# Patient Record
Sex: Male | Born: 1935 | Race: Black or African American | Hispanic: No
Health system: Southern US, Community
[De-identification: ages and names within clinical notes are randomized; demographics above are authoritative.]

## PROBLEM LIST (undated history)

## (undated) DIAGNOSIS — E119 Type 2 diabetes mellitus without complications: Secondary | ICD-10-CM

## (undated) DIAGNOSIS — N289 Disorder of kidney and ureter, unspecified: Secondary | ICD-10-CM

## (undated) DIAGNOSIS — Z930 Tracheostomy status: Secondary | ICD-10-CM

## (undated) DIAGNOSIS — R4702 Dysphasia: Secondary | ICD-10-CM

## (undated) DIAGNOSIS — I509 Heart failure, unspecified: Secondary | ICD-10-CM

## (undated) DIAGNOSIS — D649 Anemia, unspecified: Secondary | ICD-10-CM

## (undated) DIAGNOSIS — J449 Chronic obstructive pulmonary disease, unspecified: Secondary | ICD-10-CM

---

## 2014-05-06 ENCOUNTER — Inpatient Hospital Stay (HOSPITAL_COMMUNITY)
Admission: EM | Admit: 2014-05-06 | Discharge: 2014-05-15 | DRG: 870 | Disposition: A | Discharge status: Other Acute Inpatient Hospital | Payer: Medicare Other | Attending: Internal Medicine | Admitting: Internal Medicine

## 2014-05-06 ENCOUNTER — Emergency Department (HOSPITAL_COMMUNITY): Payer: Medicare Other

## 2014-05-06 ENCOUNTER — Encounter (HOSPITAL_COMMUNITY): Payer: Self-pay | Admitting: Emergency Medicine

## 2014-05-06 DIAGNOSIS — Y95 Nosocomial condition: Secondary | ICD-10-CM | POA: Diagnosis not present

## 2014-05-06 DIAGNOSIS — Z452 Encounter for adjustment and management of vascular access device: Secondary | ICD-10-CM

## 2014-05-06 DIAGNOSIS — E870 Hyperosmolality and hypernatremia: Secondary | ICD-10-CM | POA: Diagnosis not present

## 2014-05-06 DIAGNOSIS — G9340 Encephalopathy, unspecified: Secondary | ICD-10-CM | POA: Diagnosis not present

## 2014-05-06 DIAGNOSIS — J441 Chronic obstructive pulmonary disease with (acute) exacerbation: Secondary | ICD-10-CM

## 2014-05-06 DIAGNOSIS — J189 Pneumonia, unspecified organism: Secondary | ICD-10-CM | POA: Diagnosis not present

## 2014-05-06 DIAGNOSIS — I950 Idiopathic hypotension: Secondary | ICD-10-CM

## 2014-05-06 DIAGNOSIS — N183 Chronic kidney disease, stage 3 (moderate): Secondary | ICD-10-CM | POA: Diagnosis not present

## 2014-05-06 DIAGNOSIS — D649 Anemia, unspecified: Secondary | ICD-10-CM | POA: Diagnosis present

## 2014-05-06 DIAGNOSIS — R1310 Dysphagia, unspecified: Secondary | ICD-10-CM | POA: Diagnosis not present

## 2014-05-06 DIAGNOSIS — N179 Acute kidney failure, unspecified: Secondary | ICD-10-CM | POA: Diagnosis not present

## 2014-05-06 DIAGNOSIS — I5033 Acute on chronic diastolic (congestive) heart failure: Secondary | ICD-10-CM | POA: Diagnosis not present

## 2014-05-06 DIAGNOSIS — R680 Hypothermia, not associated with low environmental temperature: Secondary | ICD-10-CM | POA: Diagnosis not present

## 2014-05-06 DIAGNOSIS — E119 Type 2 diabetes mellitus without complications: Secondary | ICD-10-CM

## 2014-05-06 DIAGNOSIS — A498 Other bacterial infections of unspecified site: Secondary | ICD-10-CM

## 2014-05-06 DIAGNOSIS — Z794 Long term (current) use of insulin: Secondary | ICD-10-CM

## 2014-05-06 DIAGNOSIS — R4702 Dysphasia: Secondary | ICD-10-CM

## 2014-05-06 DIAGNOSIS — R159 Full incontinence of feces: Secondary | ICD-10-CM | POA: Diagnosis present

## 2014-05-06 DIAGNOSIS — R532 Functional quadriplegia: Secondary | ICD-10-CM | POA: Diagnosis present

## 2014-05-06 DIAGNOSIS — Z515 Encounter for palliative care: Secondary | ICD-10-CM | POA: Insufficient documentation

## 2014-05-06 DIAGNOSIS — D638 Anemia in other chronic diseases classified elsewhere: Secondary | ICD-10-CM | POA: Diagnosis not present

## 2014-05-06 DIAGNOSIS — J42 Unspecified chronic bronchitis: Secondary | ICD-10-CM

## 2014-05-06 DIAGNOSIS — Z9911 Dependence on respirator [ventilator] status: Secondary | ICD-10-CM | POA: Diagnosis not present

## 2014-05-06 DIAGNOSIS — J9621 Acute and chronic respiratory failure with hypoxia: Secondary | ICD-10-CM | POA: Insufficient documentation

## 2014-05-06 DIAGNOSIS — I9589 Other hypotension: Secondary | ICD-10-CM

## 2014-05-06 DIAGNOSIS — J449 Chronic obstructive pulmonary disease, unspecified: Secondary | ICD-10-CM | POA: Diagnosis present

## 2014-05-06 DIAGNOSIS — I129 Hypertensive chronic kidney disease with stage 1 through stage 4 chronic kidney disease, or unspecified chronic kidney disease: Secondary | ICD-10-CM | POA: Diagnosis not present

## 2014-05-06 DIAGNOSIS — R4701 Aphasia: Secondary | ICD-10-CM | POA: Diagnosis not present

## 2014-05-06 DIAGNOSIS — I5030 Unspecified diastolic (congestive) heart failure: Secondary | ICD-10-CM | POA: Diagnosis not present

## 2014-05-06 DIAGNOSIS — I4891 Unspecified atrial fibrillation: Secondary | ICD-10-CM | POA: Diagnosis present

## 2014-05-06 DIAGNOSIS — IMO0002 Reserved for concepts with insufficient information to code with codable children: Secondary | ICD-10-CM

## 2014-05-06 DIAGNOSIS — J440 Chronic obstructive pulmonary disease with acute lower respiratory infection: Secondary | ICD-10-CM | POA: Diagnosis not present

## 2014-05-06 DIAGNOSIS — E43 Unspecified severe protein-calorie malnutrition: Secondary | ICD-10-CM | POA: Diagnosis not present

## 2014-05-06 DIAGNOSIS — L89152 Pressure ulcer of sacral region, stage 2: Secondary | ICD-10-CM | POA: Diagnosis present

## 2014-05-06 DIAGNOSIS — Z1624 Resistance to multiple antibiotics: Secondary | ICD-10-CM

## 2014-05-06 DIAGNOSIS — B9620 Unspecified Escherichia coli [E. coli] as the cause of diseases classified elsewhere: Secondary | ICD-10-CM

## 2014-05-06 DIAGNOSIS — A419 Sepsis, unspecified organism: Secondary | ICD-10-CM | POA: Diagnosis present

## 2014-05-06 DIAGNOSIS — I509 Heart failure, unspecified: Secondary | ICD-10-CM

## 2014-05-06 DIAGNOSIS — E1121 Type 2 diabetes mellitus with diabetic nephropathy: Secondary | ICD-10-CM | POA: Diagnosis present

## 2014-05-06 DIAGNOSIS — N189 Chronic kidney disease, unspecified: Secondary | ICD-10-CM

## 2014-05-06 DIAGNOSIS — Z930 Tracheostomy status: Secondary | ICD-10-CM

## 2014-05-06 DIAGNOSIS — E1165 Type 2 diabetes mellitus with hyperglycemia: Secondary | ICD-10-CM

## 2014-05-06 DIAGNOSIS — IMO0001 Reserved for inherently not codable concepts without codable children: Secondary | ICD-10-CM | POA: Insufficient documentation

## 2014-05-06 DIAGNOSIS — N139 Obstructive and reflux uropathy, unspecified: Secondary | ICD-10-CM | POA: Diagnosis not present

## 2014-05-06 DIAGNOSIS — R6520 Severe sepsis without septic shock: Secondary | ICD-10-CM | POA: Diagnosis present

## 2014-05-06 DIAGNOSIS — E872 Acidosis: Secondary | ICD-10-CM | POA: Diagnosis not present

## 2014-05-06 DIAGNOSIS — F0390 Unspecified dementia without behavioral disturbance: Secondary | ICD-10-CM | POA: Diagnosis present

## 2014-05-06 DIAGNOSIS — R0600 Dyspnea, unspecified: Secondary | ICD-10-CM

## 2014-05-06 DIAGNOSIS — N390 Urinary tract infection, site not specified: Secondary | ICD-10-CM | POA: Diagnosis present

## 2014-05-06 DIAGNOSIS — J9602 Acute respiratory failure with hypercapnia: Secondary | ICD-10-CM

## 2014-05-06 DIAGNOSIS — J9601 Acute respiratory failure with hypoxia: Secondary | ICD-10-CM

## 2014-05-06 HISTORY — DX: Anemia, unspecified: D64.9

## 2014-05-06 HISTORY — DX: Disorder of kidney and ureter, unspecified: N28.9

## 2014-05-06 HISTORY — DX: Type 2 diabetes mellitus without complications: E11.9

## 2014-05-06 HISTORY — DX: Tracheostomy status: Z93.0

## 2014-05-06 HISTORY — DX: Heart failure, unspecified: I50.9

## 2014-05-06 HISTORY — DX: Chronic obstructive pulmonary disease, unspecified: J44.9

## 2014-05-06 HISTORY — DX: Dysphasia: R47.02

## 2014-05-06 LAB — BASIC METABOLIC PANEL
Anion gap: 16 — ABNORMAL HIGH (ref 5–15)
BUN: 117 mg/dL — ABNORMAL HIGH (ref 6–23)
CALCIUM: 9.3 mg/dL (ref 8.4–10.5)
CO2: 24 meq/L (ref 19–32)
Chloride: 93 mEq/L — ABNORMAL LOW (ref 96–112)
Creatinine, Ser: 4.68 mg/dL — ABNORMAL HIGH (ref 0.50–1.35)
GFR calc Af Amer: 12 mL/min — ABNORMAL LOW (ref >90)
GFR, EST NON AFRICAN AMERICAN: 10 mL/min — AB (ref >90)
GLUCOSE: 268 mg/dL — AB (ref 70–99)
Potassium: 4.6 mEq/L (ref 3.7–5.3)
Sodium: 133 mEq/L — ABNORMAL LOW (ref 137–147)

## 2014-05-06 LAB — URINALYSIS, ROUTINE W REFLEX MICROSCOPIC
Glucose, UA: 250 mg/dL — AB
Ketones, ur: 15 mg/dL — AB
NITRITE: POSITIVE — AB
PROTEIN: 100 mg/dL — AB
Specific Gravity, Urine: 1.02 (ref 1.005–1.030)
UROBILINOGEN UA: 0.2 mg/dL (ref 0.0–1.0)
pH: 5 (ref 5.0–8.0)

## 2014-05-06 LAB — CBC WITH DIFFERENTIAL/PLATELET
Basophils Absolute: 0 10*3/uL (ref 0.0–0.1)
Basophils Relative: 0 % (ref 0–1)
EOS ABS: 0.1 10*3/uL (ref 0.0–0.7)
EOS PCT: 1 % (ref 0–5)
HEMATOCRIT: 25.4 % — AB (ref 39.0–52.0)
Hemoglobin: 8.1 g/dL — ABNORMAL LOW (ref 13.0–17.0)
LYMPHS ABS: 0.6 10*3/uL — AB (ref 0.7–4.0)
Lymphocytes Relative: 5 % — ABNORMAL LOW (ref 12–46)
MCH: 26.9 pg (ref 26.0–34.0)
MCHC: 31.9 g/dL (ref 30.0–36.0)
MCV: 84.4 fL (ref 78.0–100.0)
MONO ABS: 0.5 10*3/uL (ref 0.1–1.0)
Monocytes Relative: 4 % (ref 3–12)
Neutro Abs: 10.2 10*3/uL — ABNORMAL HIGH (ref 1.7–7.7)
Neutrophils Relative %: 90 % — ABNORMAL HIGH (ref 43–77)
Platelets: 185 10*3/uL (ref 150–400)
RBC: 3.01 MIL/uL — AB (ref 4.22–5.81)
RDW: 15.5 % (ref 11.5–15.5)
WBC: 11.3 10*3/uL — AB (ref 4.0–10.5)

## 2014-05-06 LAB — URINE MICROSCOPIC-ADD ON

## 2014-05-06 LAB — I-STAT CG4 LACTIC ACID, ED: Lactic Acid, Venous: 1.28 mmol/L (ref 0.5–2.2)

## 2014-05-06 NOTE — ED Notes (Signed)
Per [facility], Pt from [facility], staff reported urinary retention, when they went to change foley catheter and noticed blood in urine so they replaced the same foley and had no urine output. Bladder scan showed . Staff reported patient making amber urine this AM

## 2014-05-06 NOTE — ED Notes (Signed)
Pt. Arrived with foley catheter in place. Foley taken out and bleeding with clots from meatus. Dr. Rubin PayorPickering at bedside to assess. New foley catheter placed. Initially blood came out from foley and then brown urine/pus noted.

## 2014-05-06 NOTE — ED Notes (Signed)
IV team called to change PICC line dressing.

## 2014-05-06 NOTE — H&P (Addendum)
[facility] History and Physical  Shawn Lamb [id number] DOB: 24-Sep-1922 DOA: 05/06/2014  Referring physician: ED physician PCP: Hillary BowROWLEY, MCKAY, MD  Specialists:   Chief Complaint: AMS urinary retention  HPI: Shawn DadForrester Modica is a 80 y.o. male with past medical history of COPD, tracheostomy dependence, dementia, diabetes, CHF, who presents with AMS and urinary retention.   Patient has AMS. History was obtained from her daughters. Patient has been in [facility] for treatment and weaning off trach since 03/14/14 per his daughters. Patient has dwelling foley catheter. Nursing staff noticed that patient has urinary retention and blood clot from Foley catheter today. Patient's mental status was also worse than his baseline. Of note, patient has recurrent UTI. 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 Patient dose not have fever, chills. He seems not have abdominal pain. No diarrhea. No rashes.   Patient was found to have leukocytosis 11.3; elevated Cre from 1.6 to 4.8. Tachycardia.   Review of Systems: As presented in the history of presenting illness, rest negative.  Where does patient live?  From [facility] (since 03/14/14) Can patient participate in ADLs? None  Allergy:  Allergies  Allergen Reactions  . Sulfa Antibiotics Other (See Comments)    unknown  . Vancomycin Other (See Comments)    unknown  . Vioxx [Rofecoxib] Other (See Comments)    unknown  . Zyvox [Linezolid] Other (See Comments)    unknown    Past Medical History  Diagnosis Date  . COPD (chronic obstructive pulmonary disease)   . Anemia   . Renal disorder   . Dysphasia   . Tracheostomy dependent   . Diabetes mellitus without complication   . CHF (congestive heart  failure)     History reviewed. No pertinent past surgical history.  Social History:  reports that he does not drink alcohol. His tobacco and drug histories are not on file.  Family History: Can not obtained due to AMS  Prior to Admission medications   Not on File    Physical Exam: Filed Vitals:   05/07/14 0030 05/07/14 0300 05/07/14 0322 05/07/14 0400  BP: 103/46 102/34  94/41  Pulse: 96 91 92 91  Temp:      TempSrc:      Resp: 20 15 19 25   Height:      SpO2: 96% 97% 100% 96%   General: Chronically ill-appearing male  HEENT:       Eyes: PERRL, EOMI, no scleral icterus       ENT: No discharge from the ears and nose, no pharynx injection, no tonsillar enlargement.        Neck: No JVD, no bruit, no mass felt. Cardiac: S1/S2, RRR, No murmurs, gallops or rubs Pulm: Trach in place. He is in respiratory distress (mechanically ventilated). Coarse rhonchi bilaterally Abd: Soft, nondistended, nontender, no rebound pain, no organomegaly, BS present Ext: 1+ pitting leg edema (right is worse than the left, which is chronic per his daughters). 2+DP/PT pulse bilaterally Musculoskeletal: No joint deformities Skin: No rashes.  Neuro: AMS, not oriented X3, intermittently follows commands. Cranial nerves II-XII grossly intact, moves all extremities. Brachial reflex 2+ bilaterally. Knee reflex 1+ bilaterally. Negative Babinski's sign.   Labs on Admission:  Basic Metabolic Panel:  Recent Labs Lab 05/06/14 1936 05/07/14 0213  NA 133* 133*  K 4.6 4.6  CL 93* 95*  CO2 24 23  GLUCOSE 268* 333*  BUN 117* 126*  CREATININE 4.68* 4.51*  CALCIUM 9.3 8.8   Liver Function Tests:  Recent Labs Lab 05/07/14 0213  AST 19  ALT 29  ALKPHOS 108  BILITOT 0.3  PROT 6.1  ALBUMIN 1.7*   No results found for this basename: LIPASE, AMYLASE,  in the last 168 hours No results found for this basename: AMMONIA,  in the last 168 hours CBC:  Recent Labs Lab 05/06/14 1936 05/07/14 0213  WBC  11.3* 9.5  NEUTROABS 10.2*  --   HGB 8.1* 6.8*  HCT 25.4* 21.3*  MCV 84.4 81.0  PLT 185 179   Cardiac Enzymes:  Recent Labs Lab 05/07/14 0213  TROPONINI <0.30    BNP (last 3 results)  Recent Labs  05/07/14 0213  PROBNP 3433.0*   CBG: No results found for this basename: GLUCAP,  in the last 168 hours  Radiological Exams on Admission: [facility]  05/07/2014   CLINICAL DATA:  Acute kidney disease. Hematuria. Urinary retention.  EXAM: RENAL/URINARY TRACT ULTRASOUND COMPLETE  COMPARISON:  None.  FINDINGS: Right Kidney:  Length: 10.3 cm. Thinning of renal parenchyma with diffuse increased echotexture consistent with atrophy and medical renal disease. No hydronephrosis or solid mass identified.  Left Kidney:  Length: 10.7 cm. Mild diffuse increased parenchymal echotexture suggesting medical renal disease. No hydronephrosis or solid mass identified.  Bladder:  Bladder is decompressed with a Foley catheter and cannot be evaluated.  Incidental mode of sludge in the gallbladder. No discrete stone or wall thickening is appreciated.  IMPRESSION: No hydronephrosis in either kidney. Increased renal parenchymal echotexture bilaterally consistent with chronic medical renal disease.   Electronically Signed   By: Burman NievesWilliam  Stevens M.D.   On: 05/07/2014 03:30   Dg Chest Portable 1 View  05/06/2014   CLINICAL DATA:  Fever.  EXAM: PORTABLE CHEST - 1 VIEW  COMPARISON:  None.  FINDINGS: The heart size and mediastinal contours are within normal limits. Tracheostomy is in grossly good position. Right-sided PICC line is noted with tip in the expected position of right axillary vein. No pneumothorax or significant pleural effusion is noted. Calcified pleural plaques are seen bilaterally suggesting asbestos exposure. No acute pulmonary disease is noted. The visualized skeletal structures are unremarkable.  IMPRESSION: Right-sided PICC line tip seen in expected position of right axillary vein. Tracheostomy tube in  grossly good position. Calcified pleural plaques suggesting asbestos exposure.   Electronically Signed   By: Roque LiasJames  Green M.D.   On: 05/06/2014 21:33    EKG: not done on admission  Assessment/Plan Principal Problem:   Sepsis secondary to UTI Active Problems:   UTI (lower urinary tract infection)   COPD (chronic obstructive pulmonary disease)   Anemia   Tracheostomy dependent   Diabetes mellitus without complication   CHF (congestive heart failure)   Acute encephalopathy  1. Sepsis secondary to UTI: per progressive note from [facility], his urine culture on 05/01/14 was positive for vancomycin susceptible enterococcus, which are sensitive to nitrofurantoin, penicillin and vancomycin. Patient has mild sepsis on admission with heart rate of 101 and leukocytosis. His systolic blood pressure is above 100 mmHg on admission.   - will admit to SDU - Repeat UA and urine culture - blood culture x 2 - IV rocephin  2. COPD: patient has trach in place. He has been in [facility] since 8/27 for weaning off the ventilator. His COPD seems to be stable on admission. -  continue trach/ventilation per RT - continue breathing treatment - continue home dexamethasone 4 mg daily   3. DM-II: on Levemir 10 to 30 U before admission - will continue levemir - SSI - check A1c  4. CHF: no 2d echo record available. His volume status seems to be OK on admission. Not on diuretics before admission - will watch his volume status carefully. - check proBNP - trop x 3  5. Anemia: unclear etiology. Hemolysis is less likely given normal total bilirubin. He has left side sacral decubitus which seems to be bleeding. He was transfused with 2 units of blood in last week per his daughter.  - hgb is 8.1 on admission, repeated CBC showed Hgb 6.8.  - check anemia panel - will transfuse 1 U of blood - check FOBT. If positive, may need to call GI - CBC q6h  6. Acute on CKD: previous Cre was 1.6 on 05/01/14.  His Cre is up to 4.68.  This is likely due to combination of urinary tract infection and obstruction of the Foley catheter. -will follow up renal Fx by BMP -treat UTI -check renal US -check FeNa  7. Acute encephalopathy: likely secondary to sepsis due to UTI.  - neuro check q4h, if not improving after UTI is treated, may need to get CT-head or MRI-brain  8. Sacral decubitus:  -consult to wound care  8. Goal of care: I spent lengthy time with his two daughters and discussed about the goal of care. I believe that patient needs palliative care. His two daughters are not realistic and are not ready for palliative direction They still want patient be on Full Code. This issue needs be readdressed.   DVT ppx: SCD; No SQ Heparin due to decubitus bleeding and possible GIB?  Code Status: Full code Family Communication:   Yes, patient's  Daughter  at bed side Disposition Plan: Admit to inpatient   Date of Service 05/07/2014    Lorretta Harp [facility] Pager [phone]  If 7PM-7AM, please contact night-coverage [email or web address] Password [email or web address] 05/07/2014, 6:20 AM

## 2014-05-06 NOTE — ED Notes (Signed)
Urinary catheter, irrigated, approx 1L in and approx 1L out. Initially urine was brown, heavy with sediment, at the end, urine was running clear with minimal sediment. Easy to flush and pull back with irrigation.

## 2014-05-06 NOTE — ED Notes (Signed)
Admitting physician at bedside

## 2014-05-06 NOTE — ED Provider Notes (Signed)
CSN: [id number]     Arrival date & time 05/06/14  1840 History   First MD Initiated Contact with Patient 05/06/14 1847     Chief Complaint  Patient presents with  . Hematuria  . Urinary Retention     (Consider location/radiation/quality/duration/timing/severity/associated sxs/prior Treatment) HPI Comments: Patient is 80 year old male with past medical history of COPD, tracheostomy dependence, dementia, diabetes, CHF who comes in from skilled nursing facility with chief complaint of urinary retention and hematuria. 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 Per nursing staff patient is acting his baseline however today his Foley has been draining blood and then out not draining at all. Patient cannot give any history. Per facility he has had no fevers and no change in mental status. Patient daughters are on the way to see patient as well. No change in vent  requirements.  Level V caveat as patient trached and aphasic  The history is provided by the EMS personnel. The history is limited by the condition of the patient.    Past Medical History  Diagnosis Date  . COPD (chronic obstructive pulmonary disease)   . Anemia   . Renal disorder   . Dysphasia   . Tracheostomy dependent   . Diabetes mellitus without complication   . CHF (congestive heart failure)    History reviewed. No pertinent past surgical history. No family history on file. History  Substance Use Topics  . Smoking status: Not on file  . Smokeless tobacco: Not on file  . Alcohol Use: No    Review of Systems  Unable to perform ROS: Patient nonverbal      Allergies  Sulfa antibiotics; Vancomycin; Vioxx; and Zyvox  Home Medications   Prior to Admission medications   Not on File   BP 112/52  Pulse 97  Temp(Src) 97.7 F (36.5 C) (Oral)  Resp 23  Ht 5\' 11"  (1.803 m)  SpO2 99% Physical Exam  Vitals reviewed. Constitutional: He appears well-developed and well-nourished. No  distress.  Chronically ill-appearing male  HENT:  Head: Normocephalic and atraumatic.  Mouth/Throat: Oropharynx is clear and moist. No oropharyngeal exudate.  Eyes: Conjunctivae and EOM are normal. Pupils are equal, round, and reactive to light. Right eye exhibits no discharge. Left eye exhibits no discharge. No scleral icterus.  Neck: Normal range of motion. Neck supple.  Trach in place  Cardiovascular: Normal rate, regular rhythm, normal heart sounds and intact distal pulses.  Exam reveals no gallop and no friction rub.   No murmur heard. Pulmonary/Chest: He is in respiratory distress (mechanically ventilated). He has no wheezes. He has no rales.  Coarse rhonchi bilaterally  Abdominal: Soft. He exhibits no distension and no mass. There is no tenderness.  Musculoskeletal: Normal range of motion.  Neurological: He is alert. No cranial nerve deficit. He exhibits normal muscle tone. Coordination normal.  Patient is alert unable to establish orientation, intermittently follows commands Moving all extremities  Skin: Skin is warm. No rash noted. He is not diaphoretic.    ED Course  Procedures (including critical care time) Labs Review Labs Reviewed  CBC WITH DIFFERENTIAL - Abnormal; Notable for the following:    WBC 11.3 (*)    RBC 3.01 (*)    Hemoglobin 8.1 (*)    HCT 25.4 (*)    Neutrophils Relative % 90 (*)    Neutro Abs 10.2 (*)    Lymphocytes Relative 5 (*)    Lymphs Abs 0.6 (*)    All other components  within normal limits  BASIC METABOLIC PANEL - Abnormal; Notable for the following:    Sodium 133 (*)    Chloride 93 (*)    Glucose, Bld 268 (*)    BUN 117 (*)    Creatinine, Ser 4.68 (*)    GFR calc non Af Amer 10 (*)    GFR calc Af Amer 12 (*)    Anion gap 16 (*)    All other components within normal limits  URINALYSIS, ROUTINE W REFLEX MICROSCOPIC - Abnormal; Notable for the following:    Color, Urine BROWN (*)    APPearance TURBID (*)    Glucose, UA 250 (*)    Hgb  urine dipstick LARGE (*)    Bilirubin Urine MODERATE (*)    Ketones, ur 15 (*)    Protein, ur 100 (*)    Nitrite POSITIVE (*)    Leukocytes, UA MODERATE (*)    All other components within normal limits  URINE MICROSCOPIC-ADD ON - Abnormal; Notable for the following:    Bacteria, UA MANY (*)    All other components within normal limits  URINE CULTURE  CULTURE, BLOOD (ROUTINE X 2)  CULTURE, BLOOD (ROUTINE X 2)  I-STAT CG4 LACTIC ACID, ED    Imaging Review Dg Chest Portable 1 View  05/06/2014   CLINICAL DATA:  Fever.  EXAM: PORTABLE CHEST - 1 VIEW  COMPARISON:  None.  FINDINGS: The heart size and mediastinal contours are within normal limits. Tracheostomy is in grossly good position. Right-sided PICC line is noted with tip in the expected position of right axillary vein. No pneumothorax or significant pleural effusion is noted. Calcified pleural plaques are seen bilaterally suggesting asbestos exposure. No acute pulmonary disease is noted. The visualized skeletal structures are unremarkable.  IMPRESSION: Right-sided PICC line tip seen in expected position of right axillary vein. Tracheostomy tube in grossly good position. Calcified pleural plaques suggesting asbestos exposure.   Electronically Signed   By: Roque LiasJames  Green M.D.   On: 05/06/2014 21:33     EKG Interpretation None      MDM   Ultrasound Guided Angiocath insertion Performed by: Pilar JarvisBrtalik, Yazmina Pareja  Consent: Verbal consent obtained. Risks and benefits: risks, benefits and alternatives were discussed Time out: Immediately prior to procedure a "time out" was called to verify the correct patient, procedure, equipment, support staff and site/side marked as required.  Preparation: Patient was prepped and draped in the usual sterile fashion.  Vein Location: L AC  Ultrasound Guided  Gauge: 20  Normal blood return and flush without difficulty Patient tolerance: Patient tolerated the procedure well with no immediate  complications.     MDM: 80 year-old male comes from nursing home with chief complaint of urinary retention. Patient is chronically ill with multiple medical comorbidities currently being treated with antibiotics for VRE of his urine. It was noted that he had hematuria today his Foley catheter and that his Foley catheter was not draining. On arrival patient mental status at baseline. Afebrile. Normal vitals. Foley switched out and brown urine drained which eventually cleared. Urine shows UTI. This is expected in patient being treated for urosepsis. Electrolytes show AKI. Most recent creatinine we have is from 10/15 which shows a normal creatinine. Questionable postobstructive versus prerenal versus sepsis. Will hold off on antibiotics as patient is currently on Irtapenum for his VRE. Discussed with daughters who are at bedside as patient is very chronically ill and acutely ill as well and they wish that he be full code. We'll obtain  blood cultures and admit to medicine.  Final diagnoses:  Chronic bronchitis, unspecified chronic bronchitis type  Tracheostomy dependent  Diabetes mellitus without complication  Acute on chronic congestive heart failure, unspecified congestive heart failure type    Admit  Pilar Jarvisoug Kenzly Rogoff, MD 05/06/14 2329

## 2014-05-07 ENCOUNTER — Inpatient Hospital Stay (HOSPITAL_COMMUNITY): Payer: Medicare Other

## 2014-05-07 DIAGNOSIS — J449 Chronic obstructive pulmonary disease, unspecified: Secondary | ICD-10-CM

## 2014-05-07 DIAGNOSIS — D649 Anemia, unspecified: Secondary | ICD-10-CM

## 2014-05-07 DIAGNOSIS — G9340 Encephalopathy, unspecified: Secondary | ICD-10-CM

## 2014-05-07 DIAGNOSIS — N390 Urinary tract infection, site not specified: Secondary | ICD-10-CM

## 2014-05-07 DIAGNOSIS — N139 Obstructive and reflux uropathy, unspecified: Secondary | ICD-10-CM

## 2014-05-07 DIAGNOSIS — Z930 Tracheostomy status: Secondary | ICD-10-CM

## 2014-05-07 DIAGNOSIS — E119 Type 2 diabetes mellitus without complications: Secondary | ICD-10-CM

## 2014-05-07 DIAGNOSIS — I509 Heart failure, unspecified: Secondary | ICD-10-CM

## 2014-05-07 DIAGNOSIS — A419 Sepsis, unspecified organism: Principal | ICD-10-CM

## 2014-05-07 DIAGNOSIS — Z9911 Dependence on respirator [ventilator] status: Secondary | ICD-10-CM

## 2014-05-07 DIAGNOSIS — N179 Acute kidney failure, unspecified: Secondary | ICD-10-CM

## 2014-05-07 LAB — IRON AND TIBC
Iron: 41 ug/dL — ABNORMAL LOW (ref 42–135)
Saturation Ratios: 25 % (ref 20–55)
TIBC: 161 ug/dL — ABNORMAL LOW (ref 215–435)
UIBC: 120 ug/dL — AB (ref 125–400)

## 2014-05-07 LAB — CBC
HCT: 26 % — ABNORMAL LOW (ref 39.0–52.0)
HCT: 24.7 % — ABNORMAL LOW (ref 39.0–52.0)
HEMATOCRIT: 21.3 % — AB (ref 39.0–52.0)
HEMATOCRIT: 24.6 % — AB (ref 39.0–52.0)
Hemoglobin: 6.8 g/dL — CL (ref 13.0–17.0)
Hemoglobin: 8.5 g/dL — ABNORMAL LOW (ref 13.0–17.0)
Hemoglobin: 7.9 g/dL — ABNORMAL LOW (ref 13.0–17.0)
Hemoglobin: 8 g/dL — ABNORMAL LOW (ref 13.0–17.0)
MCH: 25.5 pg — ABNORMAL LOW (ref 26.0–34.0)
MCH: 27.2 pg (ref 26.0–34.0)
MCH: 25.8 pg — AB (ref 26.0–34.0)
MCH: 26.1 pg (ref 26.0–34.0)
MCHC: 31.5 g/dL (ref 30.0–36.0)
MCHC: 32.7 g/dL (ref 30.0–36.0)
MCHC: 32.1 g/dL (ref 30.0–36.0)
MCHC: 32.4 g/dL (ref 30.0–36.0)
MCV: 81 fL (ref 78.0–100.0)
MCV: 83.1 fL (ref 78.0–100.0)
MCV: 80.4 fL (ref 78.0–100.0)
MCV: 80.5 fL (ref 78.0–100.0)
PLATELETS: 179 10*3/uL (ref 150–400)
PLATELETS: 177 10*3/uL (ref 150–400)
PLATELETS: 178 10*3/uL (ref 150–400)
Platelets: 177 10*3/uL (ref 150–400)
RBC: 2.63 MIL/uL — ABNORMAL LOW (ref 4.22–5.81)
RBC: 3.13 MIL/uL — ABNORMAL LOW (ref 4.22–5.81)
RBC: 3.06 MIL/uL — ABNORMAL LOW (ref 4.22–5.81)
RBC: 3.07 MIL/uL — ABNORMAL LOW (ref 4.22–5.81)
RDW: 15.4 % (ref 11.5–15.5)
RDW: 15.3 % (ref 11.5–15.5)
RDW: 14.9 % (ref 11.5–15.5)
RDW: 14.9 % (ref 11.5–15.5)
WBC: 9.5 10*3/uL (ref 4.0–10.5)
WBC: 11.4 10*3/uL — ABNORMAL HIGH (ref 4.0–10.5)
WBC: 10.7 10*3/uL — ABNORMAL HIGH (ref 4.0–10.5)
WBC: 10.8 10*3/uL — AB (ref 4.0–10.5)

## 2014-05-07 LAB — FOLATE

## 2014-05-07 LAB — ABO/RH: ABO/RH(D): B POS

## 2014-05-07 LAB — COMPREHENSIVE METABOLIC PANEL
ALBUMIN: 1.7 g/dL — AB (ref 3.5–5.2)
ALK PHOS: 108 U/L (ref 39–117)
ALT: 29 U/L (ref 0–53)
ANION GAP: 15 (ref 5–15)
AST: 19 U/L (ref 0–37)
BILIRUBIN TOTAL: 0.3 mg/dL (ref 0.3–1.2)
BUN: 126 mg/dL — ABNORMAL HIGH (ref 6–23)
CHLORIDE: 95 meq/L — AB (ref 96–112)
CO2: 23 mEq/L (ref 19–32)
CREATININE: 4.51 mg/dL — AB (ref 0.50–1.35)
Calcium: 8.8 mg/dL (ref 8.4–10.5)
GFR calc Af Amer: 12 mL/min — ABNORMAL LOW (ref >90)
GFR, EST NON AFRICAN AMERICAN: 10 mL/min — AB (ref >90)
Glucose, Bld: 333 mg/dL — ABNORMAL HIGH (ref 70–99)
POTASSIUM: 4.6 meq/L (ref 3.7–5.3)
Sodium: 133 mEq/L — ABNORMAL LOW (ref 137–147)
Total Protein: 6.1 g/dL (ref 6.0–8.3)

## 2014-05-07 LAB — HEMOGLOBIN A1C
HEMOGLOBIN A1C: 7.2 % — AB (ref <5.7)
Hgb A1c MFr Bld: 7.4 % — ABNORMAL HIGH (ref <5.7)
MEAN PLASMA GLUCOSE: 160 mg/dL — AB (ref <117)
Mean Plasma Glucose: 166 mg/dL — ABNORMAL HIGH (ref <117)

## 2014-05-07 LAB — GLUCOSE, CAPILLARY
Glucose-Capillary: 303 mg/dL — ABNORMAL HIGH (ref 70–99)
Glucose-Capillary: 314 mg/dL — ABNORMAL HIGH (ref 70–99)
Glucose-Capillary: 238 mg/dL — ABNORMAL HIGH (ref 70–99)

## 2014-05-07 LAB — PROTIME-INR
INR: 1.16 (ref 0.00–1.49)
Prothrombin Time: 14.9 seconds (ref 11.6–15.2)

## 2014-05-07 LAB — TROPONIN I: Troponin I: <0.3 ng/mL (ref <0.30)

## 2014-05-07 LAB — RETICULOCYTES
RBC.: 2.65 MIL/uL — AB (ref 4.22–5.81)
RETIC CT PCT: 1 % (ref 0.4–3.1)
Retic Count, Absolute: 26.5 10*3/uL (ref 19.0–186.0)

## 2014-05-07 LAB — FERRITIN: FERRITIN: 527 ng/mL — AB (ref 22–322)

## 2014-05-07 LAB — MRSA PCR SCREENING: MRSA by PCR: POSITIVE — AB

## 2014-05-07 LAB — VITAMIN B12: Vitamin B-12: 728 pg/mL (ref 211–911)

## 2014-05-07 LAB — PREPARE RBC (CROSSMATCH)

## 2014-05-07 LAB — PRO B NATRIURETIC PEPTIDE
Pro B Natriuretic peptide (BNP): 3433 pg/mL — ABNORMAL HIGH (ref 0–450)
Pro B Natriuretic peptide (BNP): 2701 pg/mL — ABNORMAL HIGH (ref 0–450)

## 2014-05-07 LAB — CBG MONITORING, ED: Glucose-Capillary: 314 mg/dL — ABNORMAL HIGH (ref 70–99)

## 2014-05-07 MED ORDER — FREE WATER
250.0000 mL | Freq: Four times a day (QID) | Status: DC
  Administered 2014-05-07 – 2014-05-15 (×32): 250 mL

## 2014-05-07 MED ORDER — SODIUM CHLORIDE 0.9 % IV SOLN
INTRAVENOUS | Status: DC
  Administered 2014-05-07: 15:00:00 via INTRAVENOUS
  Administered 2014-05-08 – 2014-05-09 (×2): 1000 mL via INTRAVENOUS
  Administered 2014-05-09: 01:00:00 via INTRAVENOUS
  Administered 2014-05-10: 50 mL/h via INTRAVENOUS
  Administered 2014-05-11: 12:00:00 via INTRAVENOUS

## 2014-05-07 MED ORDER — VITAMIN C 500 MG PO TABS
500.0000 mg | ORAL_TABLET | Freq: Every day | ORAL | Status: DC
  Administered 2014-05-07 – 2014-05-15 (×9): 500 mg
  Filled 2014-05-07 (×9): qty 1

## 2014-05-07 MED ORDER — INSULIN DETEMIR 100 UNIT/ML ~~LOC~~ SOLN
10.0000 [IU] | Freq: Every day | SUBCUTANEOUS | Status: DC
  Administered 2014-05-07 – 2014-05-09 (×2): 10 [IU] via SUBCUTANEOUS
  Filled 2014-05-07 (×3): qty 0.1

## 2014-05-07 MED ORDER — DEXAMETHASONE 4 MG PO TABS: 4.0000 mg | ORAL_TABLET | Freq: Every day | ORAL | Status: DC

## 2014-05-07 MED ORDER — IPRATROPIUM-ALBUTEROL 0.5-2.5 (3) MG/3ML IN SOLN
3.0000 mL | Freq: Four times a day (QID) | RESPIRATORY_TRACT | Status: DC
  Administered 2014-05-07 (×2): 3 mL via RESPIRATORY_TRACT
  Filled 2014-05-07 (×2): qty 3

## 2014-05-07 MED ORDER — NOVASOURCE RENAL PO LIQD: 300.0000 mL | Freq: Four times a day (QID) | ORAL | Status: DC

## 2014-05-07 MED ORDER — INSULIN ASPART 100 UNIT/ML ~~LOC~~ SOLN: 0.0000 [IU] | Freq: Three times a day (TID) | SUBCUTANEOUS | Status: DC

## 2014-05-07 MED ORDER — NEPRO/CARBSTEADY PO LIQD: 237.0000 mL | Freq: Four times a day (QID) | ORAL | Status: DC

## 2014-05-07 MED ORDER — DIPHENHYDRAMINE HCL 12.5 MG/5ML PO LIQD
25.0000 mg | Freq: Four times a day (QID) | ORAL | Status: DC | PRN
  Filled 2014-05-07: qty 10

## 2014-05-07 MED ORDER — ADULT MULTIVITAMIN W/MINERALS CH
1.0000 | ORAL_TABLET | Freq: Every day | ORAL | Status: DC
  Administered 2014-05-07 – 2014-05-15 (×9): 1
  Filled 2014-05-07 (×9): qty 1

## 2014-05-07 MED ORDER — DEXTROSE 5 % IV SOLN: 1.0000 g | INTRAVENOUS | Status: DC

## 2014-05-07 MED ORDER — IPRATROPIUM BROMIDE 0.02 % IN SOLN: 0.5000 mg | Freq: Four times a day (QID) | RESPIRATORY_TRACT | Status: DC

## 2014-05-07 MED ORDER — PANTOPRAZOLE SODIUM 40 MG IV SOLR
40.0000 mg | Freq: Two times a day (BID) | INTRAVENOUS | Status: DC
  Administered 2014-05-07 – 2014-05-15 (×17): 40 mg via INTRAVENOUS
  Filled 2014-05-07 (×18): qty 40

## 2014-05-07 MED ORDER — ALBUTEROL SULFATE (2.5 MG/3ML) 0.083% IN NEBU: 2.5000 mg | INHALATION_SOLUTION | Freq: Four times a day (QID) | RESPIRATORY_TRACT | Status: DC

## 2014-05-07 MED ORDER — SODIUM CHLORIDE 0.9 % IV SOLN
250.0000 mg | Freq: Two times a day (BID) | INTRAVENOUS | Status: AC
  Administered 2014-05-07 – 2014-05-10 (×6): 250 mg via INTRAVENOUS
  Filled 2014-05-07 (×7): qty 250

## 2014-05-07 MED ORDER — SODIUM CHLORIDE 0.9 % IV SOLN
Freq: Once | INTRAVENOUS | Status: AC
  Administered 2014-05-07: 07:00:00 via INTRAVENOUS

## 2014-05-07 MED ORDER — DEXAMETHASONE 4 MG PO TABS
4.0000 mg | ORAL_TABLET | Freq: Every day | ORAL | Status: DC
  Administered 2014-05-07 – 2014-05-15 (×9): 4 mg
  Filled 2014-05-07 (×9): qty 1

## 2014-05-07 MED ORDER — FAMOTIDINE 20 MG PO TABS
20.0000 mg | ORAL_TABLET | Freq: Two times a day (BID) | ORAL | Status: DC
Start: 2014-05-07 — End: 2014-05-07
  Administered 2014-05-07: 20 mg
  Filled 2014-05-07 (×2): qty 1

## 2014-05-07 MED ORDER — ALBUTEROL SULFATE (2.5 MG/3ML) 0.083% IN NEBU: 2.5000 mg | INHALATION_SOLUTION | RESPIRATORY_TRACT | Status: DC | PRN

## 2014-05-07 MED ORDER — TAMSULOSIN HCL 0.4 MG PO CAPS
0.4000 mg | ORAL_CAPSULE | Freq: Every day | ORAL | Status: DC
  Administered 2014-05-07 – 2014-05-08 (×2): 0.4 mg via ORAL
  Filled 2014-05-07 (×2): qty 1

## 2014-05-07 MED ORDER — SENNA 8.6 MG PO TABS
1.0000 | ORAL_TABLET | Freq: Every day | ORAL | Status: DC
  Administered 2014-05-07 – 2014-05-15 (×9): 8.6 mg
  Filled 2014-05-07 (×10): qty 1

## 2014-05-07 MED ORDER — INSULIN ASPART 100 UNIT/ML ~~LOC~~ SOLN
0.0000 [IU] | SUBCUTANEOUS | Status: DC
  Administered 2014-05-07: 7 [IU] via SUBCUTANEOUS
  Administered 2014-05-07: 3 [IU] via SUBCUTANEOUS
  Administered 2014-05-08: 1 [IU] via SUBCUTANEOUS
  Administered 2014-05-08: 2 [IU] via SUBCUTANEOUS
  Administered 2014-05-10 (×2): 3 [IU] via SUBCUTANEOUS
  Administered 2014-05-10: 2 [IU] via SUBCUTANEOUS
  Administered 2014-05-10: 1 [IU] via SUBCUTANEOUS
  Administered 2014-05-10: 3 [IU] via SUBCUTANEOUS
  Administered 2014-05-10: 2 [IU] via SUBCUTANEOUS
  Administered 2014-05-11: 3 [IU] via SUBCUTANEOUS
  Administered 2014-05-11 (×2): 5 [IU] via SUBCUTANEOUS
  Administered 2014-05-11: 2 [IU] via SUBCUTANEOUS
  Administered 2014-05-11: 5 [IU] via SUBCUTANEOUS
  Administered 2014-05-11 – 2014-05-12 (×2): 3 [IU] via SUBCUTANEOUS
  Administered 2014-05-12: 5 [IU] via SUBCUTANEOUS
  Administered 2014-05-12: 3 [IU] via SUBCUTANEOUS
  Administered 2014-05-12: 5 [IU] via SUBCUTANEOUS
  Administered 2014-05-12: 3 [IU] via SUBCUTANEOUS
  Administered 2014-05-12: 5 [IU] via SUBCUTANEOUS
  Administered 2014-05-13: 7 [IU] via SUBCUTANEOUS
  Administered 2014-05-13 (×2): 2 [IU] via SUBCUTANEOUS
  Administered 2014-05-13: 5 [IU] via SUBCUTANEOUS
  Administered 2014-05-13: 3 [IU] via SUBCUTANEOUS
  Administered 2014-05-13: 2 [IU] via SUBCUTANEOUS
  Administered 2014-05-14: 3 [IU] via SUBCUTANEOUS
  Administered 2014-05-14 (×2): 2 [IU] via SUBCUTANEOUS
  Administered 2014-05-14: 3 [IU] via SUBCUTANEOUS

## 2014-05-07 MED ORDER — FREE WATER
250.0000 mL | Freq: Four times a day (QID) | Status: DC
  Administered 2014-05-07: 250 mL

## 2014-05-07 MED ORDER — INSULIN DETEMIR 100 UNIT/ML ~~LOC~~ SOLN: 10.0000 [IU] | Freq: Every day | SUBCUTANEOUS | Status: DC

## 2014-05-07 MED ORDER — HEPARIN SODIUM (PORCINE) 5000 UNIT/ML IJ SOLN: 5000.0000 [IU] | Freq: Three times a day (TID) | INTRAMUSCULAR | Status: DC

## 2014-05-07 MED ORDER — TAMSULOSIN HCL 0.4 MG PO CAPS: 0.4000 mg | ORAL_CAPSULE | Freq: Every day | ORAL | Status: DC

## 2014-05-07 MED ORDER — ACETAMINOPHEN 650 MG RE SUPP: 650.0000 mg | Freq: Four times a day (QID) | RECTAL | Status: DC | PRN

## 2014-05-07 MED ORDER — ACETAMINOPHEN 325 MG PO TABS: 650.0000 mg | ORAL_TABLET | Freq: Four times a day (QID) | ORAL | Status: DC | PRN

## 2014-05-07 MED ORDER — DOCUSATE SODIUM 50 MG/5ML PO LIQD
100.0000 mg | Freq: Two times a day (BID) | ORAL | Status: DC | PRN
  Filled 2014-05-07: qty 10

## 2014-05-07 MED ORDER — SODIUM CHLORIDE 0.9 % IJ SOLN
3.0000 mL | Freq: Two times a day (BID) | INTRAMUSCULAR | Status: DC
  Administered 2014-05-07 – 2014-05-14 (×12): 3 mL via INTRAVENOUS
  Administered 2014-05-14: 10 mL via INTRAVENOUS

## 2014-05-07 MED ORDER — SODIUM CHLORIDE 0.9 % IV SOLN
1.0000 g | INTRAVENOUS | Status: DC
  Administered 2014-05-07: 1 g via INTRAVENOUS
  Filled 2014-05-07: qty 1

## 2014-05-07 MED ORDER — INSULIN DETEMIR 100 UNIT/ML ~~LOC~~ SOLN
30.0000 [IU] | Freq: Every day | SUBCUTANEOUS | Status: DC
  Administered 2014-05-07 – 2014-05-08 (×2): 30 [IU] via SUBCUTANEOUS
  Filled 2014-05-07 (×5): qty 0.3

## 2014-05-07 MED ORDER — DEXTROSE 5 % IV SOLN
1.0000 g | INTRAVENOUS | Status: DC
  Administered 2014-05-07: 1 g via INTRAVENOUS
  Filled 2014-05-07: qty 10

## 2014-05-07 MED ORDER — CHLORHEXIDINE GLUCONATE CLOTH 2 % EX PADS
6.0000 | MEDICATED_PAD | Freq: Every day | CUTANEOUS | Status: AC
  Administered 2014-05-08 – 2014-05-12 (×5): 6 via TOPICAL

## 2014-05-07 MED ORDER — IPRATROPIUM-ALBUTEROL 0.5-2.5 (3) MG/3ML IN SOLN
3.0000 mL | Freq: Four times a day (QID) | RESPIRATORY_TRACT | Status: DC
  Administered 2014-05-07 – 2014-05-12 (×20): 3 mL via RESPIRATORY_TRACT
  Filled 2014-05-07 (×20): qty 3

## 2014-05-07 MED ORDER — FAMOTIDINE 20 MG PO TABS
20.0000 mg | ORAL_TABLET | Freq: Two times a day (BID) | ORAL | Status: DC
  Administered 2014-05-07 – 2014-05-08 (×3): 20 mg
  Filled 2014-05-07 (×4): qty 1

## 2014-05-07 MED ORDER — POLYVINYL ALCOHOL 1.4 % OP SOLN: 1.0000 [drp] | Freq: Every day | OPHTHALMIC | Status: DC | PRN

## 2014-05-07 MED ORDER — MUPIROCIN 2 % EX OINT
1.0000 "application " | TOPICAL_OINTMENT | Freq: Two times a day (BID) | CUTANEOUS | Status: AC
  Administered 2014-05-07 – 2014-05-12 (×10): 1 via NASAL
  Filled 2014-05-07: qty 22

## 2014-05-07 MED ORDER — NEPRO/CARBSTEADY PO LIQD
1000.0000 mL | ORAL | Status: DC
  Administered 2014-05-07 – 2014-05-14 (×8): 1000 mL
  Filled 2014-05-07 (×12): qty 1000

## 2014-05-07 MED ORDER — CHLORHEXIDINE GLUCONATE 0.12 % MT SOLN
15.0000 mL | Freq: Two times a day (BID) | OROMUCOSAL | Status: DC
  Administered 2014-05-07 – 2014-05-15 (×17): 15 mL via OROMUCOSAL
  Filled 2014-05-07 (×17): qty 15

## 2014-05-07 NOTE — Progress Notes (Signed)
OT Cancellation Note  Patient Details Name: Shawn Lamb MRN: [id number] DOB: 1923-02-14   Cancelled Treatment:    Reason Eval/Treat Not Completed:  Pt remains on strict bedrest.  Will continue to follow.  [name], Angely Dietz Lynn 05/07/2014, 4:02 PM

## 2014-05-07 NOTE — Consult Note (Addendum)
[facility]  Total days of antibiotics 2        Day 1 ertapenem        ( 1 dose of ctX)       Reason for Consult: complicated uti    Referring Physician: Daleen Bo  Principal Problem:   Sepsis secondary to UTI Active Problems:   UTI (lower urinary tract infection)   COPD (chronic obstructive pulmonary disease)   Anemia   Tracheostomy dependent   Diabetes mellitus without complication   CHF (congestive heart failure)   Acute encephalopathy    HPI: Shawn Lamb is a 80 y.o. male with PMHx of COPD, tracheostomy dependence, dementia, diabetes, CHF, resident of an [place] who presents with AMS and urinary retention. Patient has been in [place] for treatment and weaning off trach since 03/14/14.  Patient has chronic foley catheter.  He was admitted on 10/19 due to  urinary retention and blood clot from Foley catheter today. Patient's mental status was also worse than his baseline. He is currently being treated for  Multi-drug resistant e.coli as well as enterococcal complicated UTI with ertapenem, since 05/01/14, his urine culture was positive for vancomycin susceptible enterococcus, which are sensitive to nitrofurantoin, penicillin and vancomycin. Per [facility] chart, it appears tha the patient had UA/Urine Cx on 10/9 concerning for recurrent uti with ecoli ( R bactrim, R FQ, R amp, R piptazo, S nitrofurantoin, S CTX, S cefepime, S imi), and amp S enterococcus. It looks like he was started on cefepime but then had worsening aki noted which they thought maybe due to AIN. He was switched to ertapenem.  Patient dose not have fever, chills. He seems not have abdominal pain. No diarrhea. No rashes. Patient was found to have leukocytosis 11.3 with 90% N, (WBC of 11 also noted on 10/16); acute on chronic kidney disease with cr 4.8 up from 1.6. He received one dose of ceftriaxone overnight and developed rash thus now switched to ertapenem. Patient's family also meeting  with palliative care team due to poor prognosis.  hpi extracted from chart review since patient unable to answer questions.  Past Medical History  Diagnosis Date  . COPD (chronic obstructive pulmonary disease)   . Anemia   . Renal disorder   . Dysphasia   . Tracheostomy dependent   . Diabetes mellitus without complication   . CHF (congestive heart failure)     Allergies:  Allergies  Allergen Reactions  . Sulfa Antibiotics Other (See Comments)    unknown  . Vancomycin Other (See Comments)    unknown  . Vioxx [Rofecoxib] Other (See Comments)    unknown  . Zyvox [Linezolid] Other (See Comments)    unknown     MEDICATIONS: . chlorhexidine  15 mL Mouth/Throat BID  . dexamethasone  4 mg Per Tube Daily  . ertapenem  1 g Intravenous Q24H  . famotidine  20 mg Per Tube BID  . feeding supplement (NEPRO CARB STEADY)  237 mL Oral 4 times per day  . free water  250 mL Per Tube Q6H  . insulin aspart  0-9 Units Subcutaneous 6 times per day  . insulin detemir  30 Units Subcutaneous Daily   And  . insulin detemir  10 Units Subcutaneous QHS  . ipratropium-albuterol  3 mL Nebulization Q6H  . multivitamin with minerals  1 tablet Per Tube Daily  . pantoprazole (PROTONIX) IV  40 mg Intravenous Q12H  . senna  1 tablet Per Tube Daily  .  sodium chloride  3 mL Intravenous Q12H  . tamsulosin  0.4 mg Oral Daily  . vitamin C  500 mg Per Tube Daily    History  Substance Use Topics  . Smoking status: Not on file  . Smokeless tobacco: Not on file  . Alcohol Use: No    No family history on file.  Review of Systems -  Unable to obtain due to AMS  OBJECTIVE: Temp:  [97.7 F (36.5 C)-99.2 F (37.3 C)] 98.8 F (37.1 C) (10/20 1214) Pulse Rate:  [87-102] 93 (10/20 1325) Resp:  [13-25] 14 (10/20 1325) BP: (85-129)/(34-58) 94/41 mmHg (10/20 1325) SpO2:  [95 %-100 %] 100 % (10/20 1325) FiO2 (%):  [30 %-100 %] 60 % (10/20 1325) Weight:  [219 lb (99.338 kg)] 219 lb (99.338 kg) (10/20  1325) Physical Exam  Constitutional:  He appears well-developed and chronically ill. No distress. Unable to answer to verbal command HENT:  Mouth/Throat: Oropharynx is dry. Trach in place Cardiovascular: Normal rate, regular rhythm and normal heart sounds. Exam reveals no gallop and no friction rub.  No murmur heard.  Pulmonary/Chest: Effort normal and breath sounds normal. No respiratory distress. He has no wheezes.  Abdominal: Soft. Bowel sounds are normal. He exhibits no distension. There is no tenderness.  Lymphadenopathy: no cervical adenopathy.  Neurological: does not follow command Skin: Skin is warm and dry. erythamatous macular papular rash to upper extremities    LABS: Results for orders placed during the hospital encounter of 05/06/14 (from the past 48 hour(s))  URINALYSIS, ROUTINE W REFLEX MICROSCOPIC     Status: Abnormal   Collection Time    05/06/14  7:04 PM      Result Value Ref Range   Color, Urine BROWN (*) YELLOW   Comment: BIOCHEMICALS MAY BE AFFECTED BY COLOR   APPearance TURBID (*) CLEAR   Specific Gravity, Urine 1.020  1.005 - 1.030   pH 5.0  5.0 - 8.0   Glucose, UA 250 (*) NEGATIVE mg/dL   Hgb urine dipstick LARGE (*) NEGATIVE   Bilirubin Urine MODERATE (*) NEGATIVE   Ketones, ur 15 (*) NEGATIVE mg/dL   Protein, ur 701 (*) NEGATIVE mg/dL   Urobilinogen, UA 0.2  0.0 - 1.0 mg/dL   Nitrite POSITIVE (*) NEGATIVE   Leukocytes, UA MODERATE (*) NEGATIVE  URINE MICROSCOPIC-ADD ON     Status: Abnormal   Collection Time    05/06/14  7:04 PM      Result Value Ref Range   Squamous Epithelial / LPF RARE  RARE   WBC, UA 3-6  <3 WBC/hpf   RBC / HPF TOO NUMEROUS TO COUNT  <3 RBC/hpf   Bacteria, UA MANY (*) RARE   Urine-Other URINALYSIS PERFORMED ON SUPERNATANT    CBC WITH DIFFERENTIAL     Status: Abnormal   Collection Time    05/06/14  7:36 PM      Result Value Ref Range   WBC 11.3 (*) 4.0 - 10.5 K/uL   RBC 3.01 (*) 4.22 - 5.81 MIL/uL   Hemoglobin 8.1 (*) 13.0  - 17.0 g/dL   HCT 79.7 (*) 12.9 - 08.9 %   MCV 84.4  78.0 - 100.0 fL   MCH 26.9  26.0 - 34.0 pg   MCHC 31.9  30.0 - 36.0 g/dL   RDW 64.8  47.4 - 22.9 %   Platelets 185  150 - 400 K/uL   Neutrophils Relative % 90 (*) 43 - 77 %   Neutro Abs 10.2 (*)  1.7 - 7.7 K/uL   Lymphocytes Relative 5 (*) 12 - 46 %   Lymphs Abs 0.6 (*) 0.7 - 4.0 K/uL   Monocytes Relative 4  3 - 12 %   Monocytes Absolute 0.5  0.1 - 1.0 K/uL   Eosinophils Relative 1  0 - 5 %   Eosinophils Absolute 0.1  0.0 - 0.7 K/uL   Basophils Relative 0  0 - 1 %   Basophils Absolute 0.0  0.0 - 0.1 K/uL  BASIC METABOLIC PANEL     Status: Abnormal   Collection Time    05/06/14  7:36 PM      Result Value Ref Range   Sodium 133 (*) 137 - 147 mEq/L   Potassium 4.6  3.7 - 5.3 mEq/L   Chloride 93 (*) 96 - 112 mEq/L   CO2 24  19 - 32 mEq/L   Glucose, Bld 268 (*) 70 - 99 mg/dL   BUN 117 (*) 6 - 23 mg/dL   Creatinine, Ser 4.68 (*) 0.50 - 1.35 mg/dL   Calcium 9.3  8.4 - 10.5 mg/dL   GFR calc non Af Amer 10 (*) >90 mL/min   GFR calc Af Amer 12 (*) >90 mL/min   Comment: (NOTE)     The eGFR has been calculated using the CKD EPI equation.     This calculation has not been validated in all clinical situations.     eGFR's persistently <90 mL/min signify possible Chronic Kidney     Disease.   Anion gap 16 (*) 5 - 15  I-STAT CG4 LACTIC ACID, ED     Status: None   Collection Time    05/06/14  9:19 PM      Result Value Ref Range   Lactic Acid, Venous 1.28  0.5 - 2.2 mmol/L  PROTIME-INR     Status: None   Collection Time    05/07/14  2:13 AM      Result Value Ref Range   Prothrombin Time 14.9  11.6 - 15.2 seconds   INR 1.16  0.00 - 1.49  TROPONIN I     Status: None   Collection Time    05/07/14  2:13 AM      Result Value Ref Range   Troponin I <0.30  <0.30 ng/mL   Comment:            Due to the release kinetics of cTnI,     a negative result within the first hours     of the onset of symptoms does not rule out     myocardial  infarction with certainty.     If myocardial infarction is still suspected,     repeat the test at appropriate intervals.  COMPREHENSIVE METABOLIC PANEL     Status: Abnormal   Collection Time    05/07/14  2:13 AM      Result Value Ref Range   Sodium 133 (*) 137 - 147 mEq/L   Potassium 4.6  3.7 - 5.3 mEq/L   Chloride 95 (*) 96 - 112 mEq/L   CO2 23  19 - 32 mEq/L   Glucose, Bld 333 (*) 70 - 99 mg/dL   BUN 126 (*) 6 - 23 mg/dL   Creatinine, Ser 4.51 (*) 0.50 - 1.35 mg/dL   Calcium 8.8  8.4 - 10.5 mg/dL   Total Protein 6.1  6.0 - 8.3 g/dL   Albumin 1.7 (*) 3.5 - 5.2 g/dL   AST 19  0 - 37 U/L  ALT 29  0 - 53 U/L   Alkaline Phosphatase 108  39 - 117 U/L   Total Bilirubin 0.3  0.3 - 1.2 mg/dL   GFR calc non Af Amer 10 (*) >90 mL/min   GFR calc Af Amer 12 (*) >90 mL/min   Comment: (NOTE)     The eGFR has been calculated using the CKD EPI equation.     This calculation has not been validated in all clinical situations.     eGFR's persistently <90 mL/min signify possible Chronic Kidney     Disease.   Anion gap 15  5 - 15  CBC     Status: Abnormal   Collection Time    05/07/14  2:13 AM      Result Value Ref Range   WBC 9.5  4.0 - 10.5 K/uL   RBC 2.63 (*) 4.22 - 5.81 MIL/uL   Hemoglobin 6.8 (*) 13.0 - 17.0 g/dL   Comment: REPEATED TO VERIFY     CRITICAL RESULT CALLED TO, READ BACK BY AND VERIFIED WITH:     J COOK,RN [phone] WILDERK   HCT 21.3 (*) 39.0 - 52.0 %   MCV 81.0  78.0 - 100.0 fL   MCH 25.5 (*) 26.0 - 34.0 pg   MCHC 31.5  30.0 - 36.0 g/dL   RDW 15.4  11.5 - 15.5 %   Platelets 179  150 - 400 K/uL  PRO B NATRIURETIC PEPTIDE     Status: Abnormal   Collection Time    05/07/14  2:13 AM      Result Value Ref Range   Pro B Natriuretic peptide (BNP) 3433.0 (*) 0 - 450 pg/mL  VITAMIN B12     Status: None   Collection Time    05/07/14  2:13 AM      Result Value Ref Range   Vitamin B-12 728  211 - 911 pg/mL   Comment: Performed at [facility]     Status:  None   Collection Time    05/07/14  2:13 AM      Result Value Ref Range   Folate >20.0     Comment: (NOTE)     Reference Ranges            Deficient:       0.4 - 3.3 ng/mL            Indeterminate:   3.4 - 5.4 ng/mL            Normal:              > 5.4 ng/mL     Performed at [facility] TIBC     Status: Abnormal   Collection Time    05/07/14  2:13 AM      Result Value Ref Range   Iron 41 (*) 42 - 135 ug/dL   TIBC 161 (*) 215 - 435 ug/dL   Saturation Ratios 25  20 - 55 %   UIBC 120 (*) 125 - 400 ug/dL   Comment: Performed at [facility]     Status: Abnormal   Collection Time    05/07/14  2:13 AM      Result Value Ref Range   Ferritin 527 (*) 22 - 322 ng/mL   Comment: Performed at [facility]     Status: Abnormal   Collection Time    05/07/14  2:13 AM  Result Value Ref Range   Retic Ct Pct 1.0  0.4 - 3.1 %   RBC. 2.65 (*) 4.22 - 5.81 MIL/uL   Retic Count, Manual 26.5  19.0 - 186.0 K/uL  HEMOGLOBIN A1C     Status: Abnormal   Collection Time    05/07/14  2:13 AM      Result Value Ref Range   Hemoglobin A1C 7.4 (*) <5.7 %   Comment: (NOTE)                                                                               According to the ADA Clinical Practice Recommendations for 2011, when     HbA1c is used as a screening test:      >=6.5%   Diagnostic of Diabetes Mellitus               (if abnormal result is confirmed)     5.7-6.4%   Increased risk of developing Diabetes Mellitus     References:Diagnosis and Classification of Diabetes Mellitus,Diabetes     Care,2011,34(Suppl 1):S62-S69 and Standards of Medical Care in             Diabetes - 2011,Diabetes Care,2011,34 (Suppl 1):S11-S61.   Mean Plasma Glucose 166 (*) <117 mg/dL   Comment: Performed at [facility]  TYPE AND SCREEN     Status: None   Collection Time    05/07/14  6:30 AM      Result Value Ref Range   ABO/RH(D) B POS     Antibody  Screen NEG     Sample Expiration 05/10/2014     Unit Number [id number]     Blood Component Type RED CELLS,LR     Unit division 00     Status of Unit ALLOCATED     Transfusion Status OK TO TRANSFUSE     Crossmatch Result Compatible     Unit Number [id number]     Blood Component Type RED CELLS,LR     Unit division 00     Status of Unit ALLOCATED     Transfusion Status OK TO TRANSFUSE     Crossmatch Result Compatible     Unit Number [id number]     Blood Component Type RED CELLS,LR     Unit division 00     Status of Unit ISSUED     Transfusion Status OK TO TRANSFUSE     Crossmatch Result Compatible    PREPARE RBC (CROSSMATCH)     Status: None   Collection Time    05/07/14  6:30 AM      Result Value Ref Range   Order Confirmation ORDER PROCESSED BY BLOOD BANK    ABO/RH     Status: None   Collection Time    05/07/14  6:30 AM      Result Value Ref Range   ABO/RH(D) B POS    TROPONIN I     Status: None   Collection Time    05/07/14  7:53 AM      Result Value Ref Range   Troponin I <0.30  <0.30 ng/mL   Comment:            Due to the release  kinetics of cTnI,     a negative result within the first hours     of the onset of symptoms does not rule out     myocardial infarction with certainty.     If myocardial infarction is still suspected,     repeat the test at appropriate intervals.  CBC     Status: Abnormal   Collection Time    05/07/14 10:00 AM      Result Value Ref Range   WBC 11.4 (*) 4.0 - 10.5 K/uL   RBC 3.13 (*) 4.22 - 5.81 MIL/uL   Hemoglobin 8.5 (*) 13.0 - 17.0 g/dL   Comment: REPEATED TO VERIFY     POST TRANSFUSION SPECIMEN   HCT 26.0 (*) 39.0 - 52.0 %   MCV 83.1  78.0 - 100.0 fL   MCH 27.2  26.0 - 34.0 pg   MCHC 32.7  30.0 - 36.0 g/dL   RDW 15.3  11.5 - 15.5 %   Platelets 177  150 - 400 K/uL  CBG MONITORING, ED     Status: Abnormal   Collection Time    05/07/14 11:01 AM      Result Value Ref Range   Glucose-Capillary 314 (*) 70 - 99 mg/dL  MRSA  PCR SCREENING     Status: Abnormal   Collection Time    05/07/14 12:35 PM      Result Value Ref Range   MRSA by PCR POSITIVE (*) NEGATIVE   Comment:            The GeneXpert MRSA Assay (FDA     approved for NASAL specimens     only), is one component of a     comprehensive MRSA colonization     surveillance program. It is not     intended to diagnose MRSA     infection nor to guide or     monitor treatment for     MRSA infections.     RESULT CALLED TO, READ BACK BY AND VERIFIED WITH:     WELLINGTON RN 14:05 05/07/14 ([email or web address])  PRO B NATRIURETIC PEPTIDE     Status: Abnormal   Collection Time    05/07/14  1:17 PM      Result Value Ref Range   Pro B Natriuretic peptide (BNP) 2701.0 (*) 0 - 450 pg/mL  GLUCOSE, CAPILLARY     Status: Abnormal   Collection Time    05/07/14  1:25 PM      Result Value Ref Range   Glucose-Capillary 303 (*) 70 - 99 mg/dL    MICRO: pending IMAGING: US Renal  05/07/2014   CLINICAL DATA:  Acute kidney disease. Hematuria. Urinary retention.  EXAM: RENAL/URINARY TRACT ULTRASOUND COMPLETE  COMPARISON:  None.  FINDINGS: Right Kidney:  Length: 10.3 cm. Thinning of renal parenchyma with diffuse increased echotexture consistent with atrophy and medical renal disease. No hydronephrosis or solid mass identified.  Left Kidney:  Length: 10.7 cm. Mild diffuse increased parenchymal echotexture suggesting medical renal disease. No hydronephrosis or solid mass identified.  Bladder:  Bladder is decompressed with a Foley catheter and cannot be evaluated.  Incidental mode of sludge in the gallbladder. No discrete stone or wall thickening is appreciated.  IMPRESSION: No hydronephrosis in either kidney. Increased renal parenchymal echotexture bilaterally consistent with chronic medical renal disease.   Electronically Signed   By: Lucienne Capers M.D.   On: 05/07/2014 03:30   Dg Chest Portable 1 View  05/06/2014   CLINICAL DATA:  Fever.  EXAM: PORTABLE CHEST - 1 VIEW   COMPARISON:  None.  FINDINGS: The heart size and mediastinal contours are within normal limits. Tracheostomy is in grossly good position. Right-sided PICC line is noted with tip in the expected position of right axillary vein. No pneumothorax or significant pleural effusion is noted. Calcified pleural plaques are seen bilaterally suggesting asbestos exposure. No acute pulmonary disease is noted. The visualized skeletal structures are unremarkable.  IMPRESSION: Right-sided PICC line tip seen in expected position of right axillary vein. Tracheostomy tube in grossly good position. Calcified pleural plaques suggesting asbestos exposure.   Electronically Signed   By: Sabino Dick M.D.   On: 05/06/2014 21:33    HISTORICAL MICRO/IMAGING  Assessment/Plan:  80yo M admitted from urinary obstruction, AKI and possible complicated uti. He has been treated for the past 7 days with ertapenem for ecoli. Has hx of multiple allergies  - ertapenem does not have great susceptibility for enterococcus, but unclear if it is still causing issues since recent UA only shows wbc 3-5 range. Likely colonizer - would do imipenem for addn. 3-4 days to cover drug resistant ecoli. - difficult to tell if his admission here is due to infection vs. Urinary obstruction, but appears to be the latter.  Elzie Rings [facility] [phone]

## 2014-05-07 NOTE — Progress Notes (Signed)
05/07/2014 patient is from [facility] came to the emergency room to 2 central at 1330. He is alert to self,  unable to assess orientation and non ambulatory. Patient is on a vent and have trach Shiley #6 cuff, also have a peg tube. When patient arrive to the unit and was skin was assess he have red rash on skin. Sacrum have 2 stage 2 area and incontinent and unstage area on the sacrum, fissure on the between the crack of buttock. The wound was bleeding a little. MD is aware of the wound and wound order was placed. Order for put in for air mattress. Patient was place on contact for VRE, he was swab for MRSA and he is positive. Patient  History and physical is not completed, because patient is not alert enough. [facility] Care CenterNadine Mabry Tift RN.

## 2014-05-07 NOTE — ED Notes (Signed)
Pillows removed from behind the left side and have his laying flat with the left arm up on a pillow.

## 2014-05-07 NOTE — Progress Notes (Signed)
[facility] PROGRESS NOTE  Alcus DadForrester Easterday ZOX:[id number] DOB: 07/11/1923 DOA: 05/06/2014 PCP: Hillary BowROWLEY, MCKAY, MD  Assessment/Plan: 80 y/o male with COPD, DM, CHF, A fib (not on CA due to risk of bleeding), chronic respiratory failure,  tracheostomy dependence failed weaning trail on vent dementia, decub ulcers, recurrent Pneumonias, UTIs, recurrent infections (VRE, MRSA, E coli, fungus) who presents with AMS and urinary retention, recurrent UTIs, sepsis  1. Recurrent UTI, previous multidrug resistant organisms; Pt was on tygecycline gent recently  -(multiple atx allergies, vanc, zyvox, developed rash to cefalosporins) -repeat urine, blood cultures; Pt received IV Rocephin but has generalized rash; stop rocefin, changed to ertapenem (Pt was tolerating at [facility])will ask infectious disease help with atx regimen   2. AKI on CKD (previous Cr 1.6), combination of ANT/sepsis with DM nephropathy, chronic obstructive uropathy, chronic urinary retention  -start IVF; US renal no acute hydronephrosis; treat infections  3. Chronic respiratory failure, vent dependant; COPD; cont vent management  unfortunately at risk for recurrent pneumonia;  4. Sepsis/UTI, acute on chronic recurrent infections; -cont IV atx per ID, IVF; f/u cultures;  5. Acute on chronic anemia; h/o blood transfusions; no s/s of acute bleeding;  -TF sed 1 unit (10/20); TF prn; check occult blood; start PPI for GI prophylaxis  6. CHF, ? diastolic CHF; CXR no significant edema; not on diuretics;  -cont IVF for AKI, cont monitor; diurese as needed  7. IDDM, HA1c-7.4; cont insulin regimen, titrate as needed   Prognosis is very poor with recurrent drug resistant infections, vent dependence, multiorgan dysfunction  -consult palliative care  Code Status: full Family Communication: d/w patient, his two daughter  (indicate person spoken with, relationship, and if by phone, the number) Disposition Plan: pend clinical improvement     Consultants:  ID  Procedures:  none  Antibiotics:  Ceftriaxone 10/20;   ertapenem 10/20<<<   (indicate start date, and stop date if known)  HPI/Subjective: Alert, confused  Objective: Filed Vitals:   05/07/14 1214  BP:   Pulse: 97  Temp:   Resp: 21    Intake/Output Summary (Last 24 hours) at 05/07/14 1238 Last data filed at 05/07/14 1040  Gross per 24 hour  Intake    335 ml  Output   1100 ml  Net   -765 ml   There were no vitals filed for this visit.  Exam:   General:  Alert, on vent   Cardiovascular: s1,s2 rrr  Respiratory: CTA BL  Abdomen: soft, nt,nd   Musculoskeletal: mild edema   Data Reviewed: Basic Metabolic Panel:  Recent Labs Lab 05/06/14 1936 05/07/14 0213  NA 133* 133*  K 4.6 4.6  CL 93* 95*  CO2 24 23  GLUCOSE 268* 333*  BUN 117* 126*  CREATININE 4.68* 4.51*  CALCIUM 9.3 8.8   Liver Function Tests:  Recent Labs Lab 05/07/14 0213  AST 19  ALT 29  ALKPHOS 108  BILITOT 0.3  PROT 6.1  ALBUMIN 1.7*   No results found for this basename: LIPASE, AMYLASE,  in the last 168 hours No results found for this basename: AMMONIA,  in the last 168 hours CBC:  Recent Labs Lab 05/06/14 1936 05/07/14 0213  WBC 11.3* 9.5  NEUTROABS 10.2*  --   HGB 8.1* 6.8*  HCT 25.4* 21.3*  MCV 84.4 81.0  PLT 185 179   Cardiac Enzymes:  Recent Labs Lab 05/07/14 0213 05/07/14 0753  TROPONINI <0.30 <0.30   BNP (last 3 results)  Recent Labs  05/07/14 0213  PROBNP 3433.0*  CBG:  Recent Labs Lab 05/07/14 1101  GLUCAP 314*    No results found for this or any previous visit (from the past 240 hour(s)).   Studies: [facility]  05/07/2014   CLINICAL DATA:  Acute kidney disease. Hematuria. Urinary retention.  EXAM: RENAL/URINARY TRACT ULTRASOUND COMPLETE  COMPARISON:  None.  FINDINGS: Right Kidney:  Length: 10.3 cm. Thinning of renal parenchyma with diffuse increased echotexture consistent with atrophy and medical renal  disease. No hydronephrosis or solid mass identified.  Left Kidney:  Length: 10.7 cm. Mild diffuse increased parenchymal echotexture suggesting medical renal disease. No hydronephrosis or solid mass identified.  Bladder:  Bladder is decompressed with a Foley catheter and cannot be evaluated.  Incidental mode of sludge in the gallbladder. No discrete stone or wall thickening is appreciated.  IMPRESSION: No hydronephrosis in either kidney. Increased renal parenchymal echotexture bilaterally consistent with chronic medical renal disease.   Electronically Signed   By: Burman NievesWilliam  Stevens M.D.   On: 05/07/2014 03:30   Dg Chest Portable 1 View  05/06/2014   CLINICAL DATA:  Fever.  EXAM: PORTABLE CHEST - 1 VIEW  COMPARISON:  None.  FINDINGS: The heart size and mediastinal contours are within normal limits. Tracheostomy is in grossly good position. Right-sided PICC line is noted with tip in the expected position of right axillary vein. No pneumothorax or significant pleural effusion is noted. Calcified pleural plaques are seen bilaterally suggesting asbestos exposure. No acute pulmonary disease is noted. The visualized skeletal structures are unremarkable.  IMPRESSION: Right-sided PICC line tip seen in expected position of right axillary vein. Tracheostomy tube in grossly good position. Calcified pleural plaques suggesting asbestos exposure.   Electronically Signed   By: Roque LiasJames  Green M.D.   On: 05/06/2014 21:33    Scheduled Meds: . [START ON 05/08/2014] cefTRIAXone (ROCEPHIN)  IV  1 g Intravenous Q24H  . chlorhexidine  15 mL Mouth/Throat BID  . dexamethasone  4 mg Per Tube Daily  . famotidine  20 mg Per Tube BID  . feeding supplement (NEPRO CARB STEADY)  237 mL Oral 4 times per day  . free water  250 mL Per Tube Q6H  . insulin aspart  0-9 Units Subcutaneous TID WC  . insulin detemir  30 Units Subcutaneous Daily   And  . insulin detemir  10 Units Subcutaneous QHS  . ipratropium-albuterol  3 mL Nebulization Q6H   . multivitamin with minerals  1 tablet Per Tube Daily  . senna  1 tablet Per Tube Daily  . sodium chloride  3 mL Intravenous Q12H  . tamsulosin  0.4 mg Oral Daily  . vitamin C  500 mg Per Tube Daily   Continuous Infusions:   Principal Problem:   Sepsis secondary to UTI Active Problems:   UTI (lower urinary tract infection)   COPD (chronic obstructive pulmonary disease)   Anemia   Tracheostomy dependent   Diabetes mellitus without complication   CHF (congestive heart failure)   Acute encephalopathy    Time spent: >35 minutes     Esperanza SheetsBURIEV, Drewey Begue N  [facility] Pager [phone]. If 7PM-7AM, please contact night-coverage at [email or web address], password [email or web address] 05/07/2014, 12:38 PM  LOS: 1 day

## 2014-05-07 NOTE — Progress Notes (Signed)
INITIAL NUTRITION ASSESSMENT  DOCUMENTATION CODES Per approved criteria  -Not Applicable   INTERVENTION: Initiate Nepro formula at 20 ml/hr and increase by 10 ml every 4 hours to goal rate of 50 ml/hr to provide 2160 kcals, 96 gm protein, 872 ml of free water RD to follow for nutrition care plan  NUTRITION DIAGNOSIS: Inadequate oral intake related to inability to eat as evidenced by NPO status  Goal: Pt to meet >/= 90% of their estimated nutrition needs   Monitor:  TF regimen & tolerance, weight, labs, I/O's  Reason for Assessment: Consult  80 y.o. male  Admitting Dx: Sepsis secondary to UTI  ASSESSMENT: 80 y.o. Male from [facility] with PMH of COPD, tracheostomy dependence, dysphagia, dementia, diabetes, CHF, who presented with AMS and urinary retention.  RD unable to obtain nutrition hx from patient.  [facility] paperwork reviewed.  Pt receives bolus TF regimen via PEG tube of Novasource Renal formula at 300 ml every 6 hours which provides 2400 kcals, 109 gm protein, 860 ml of free water.  Patient is currently on ventilator support via trach MV: 11.8 L/min Temp (24hrs), Avg:98.8 F (37.1 C), Min:97.7 F (36.5 C), Max:99.2 F (37.3 C)   RD consulted for TF initiation & management.  Noted prognosis is very poor.  Palliative Care Team consulted for goals of care.  Height: Ht Readings from Last 1 Encounters:  05/07/14 6' (1.829 m)    Weight: Wt Readings from Last 1 Encounters:  05/07/14 219 lb (99.338 kg)    Ideal Body Weight: 178 lb  % Ideal Body Weight: 123%  Wt Readings from Last 10 Encounters:  05/07/14 219 lb (99.338 kg)    Usual Body Weight: unable to obtain  % Usual Body Weight: ---  BMI:  Body mass index is 29.7 kg/(m^2).  Estimated Nutritional Needs: Kcal: 2000-2200 Protein: 100-110 gm Fluid: per MD  Skin: sacral decubitus [facility](CWOCN consult pending)  Diet Order: NPO  EDUCATION NEEDS: -No education needs identified at this  time   Intake/Output Summary (Last 24 hours) at 05/07/14 1439 Last data filed at 05/07/14 1346  Gross per 24 hour  Intake    335 ml  Output   1500 ml  Net  -1165 ml   Labs:   Recent Labs Lab 05/06/14 1936 05/07/14 0213  NA 133* 133*  K 4.6 4.6  CL 93* 95*  CO2 24 23  BUN 117* 126*  CREATININE 4.68* 4.51*  CALCIUM 9.3 8.8  GLUCOSE 268* 333*    CBG (last 3)   Recent Labs  05/07/14 1101 05/07/14 1325  GLUCAP 314* 303*    Scheduled Meds: . chlorhexidine  15 mL Mouth/Throat BID  . dexamethasone  4 mg Per Tube Daily  . ertapenem  1 g Intravenous Q24H  . famotidine  20 mg Per Tube BID  . feeding supplement (NEPRO CARB STEADY)  237 mL Oral 4 times per day  . free water  250 mL Per Tube Q6H  . insulin aspart  0-9 Units Subcutaneous 6 times per day  . insulin detemir  30 Units Subcutaneous Daily   And  . insulin detemir  10 Units Subcutaneous QHS  . ipratropium-albuterol  3 mL Nebulization Q6H  . multivitamin with minerals  1 tablet Per Tube Daily  . pantoprazole (PROTONIX) IV  40 mg Intravenous Q12H  . senna  1 tablet Per Tube Daily  . sodium chloride  3 mL Intravenous Q12H  . tamsulosin  0.4 mg Oral Daily  . vitamin C  500  mg Per Tube Daily    Continuous Infusions: . sodium chloride      Past Medical History  Diagnosis Date  . COPD (chronic obstructive pulmonary disease)   . Anemia   . Renal disorder   . Dysphasia   . Tracheostomy dependent   . Diabetes mellitus without complication   . CHF (congestive heart failure)     History reviewed. No pertinent past surgical history.  Maureen ChattersKatie Damire Remedios, RD, LDN Pager #: [phone] After-Hours Pager #: [phone]

## 2014-05-07 NOTE — Progress Notes (Signed)
Physical Therapy Cancel Note  Patient currently with strict bedrest orders. Please update activity level when appropriate. PT to check back on pt on 05/08/14. Thanks [place], [place]  [phone] 05/07/2014

## 2014-05-07 NOTE — ED Notes (Signed)
Patient turned and cleaned up of large amount of soft mushy stool.  Bleeding area to the left buttock cleaned (approx the size of a quarter) and non adhering dressing applied.  Area between his butt cheeks is red and with a small amount of bleeding noted.  Pillows behind his left side for repositioning.

## 2014-05-07 NOTE — ED Provider Notes (Addendum)
I saw and evaluated the patient, reviewed the resident's note and I agree with the findings and plan.   EKG Interpretation None     Patient with urinary retention and hematuria. Foley catheter appears to be plugged. His been replaced with improve urine flow. He has acute renal failure, also has urinary tract infection. Is currently on her pendulum. Will admit to internal medicine. Does have chronic trach. There is discussion of the fracture of his continued treatment. Will admit to internal medicine. I was present for the Angiocath placement by Dr. Fuller SongBritalik  Deshun Sedivy R. Rubin PayorPickering, MD 05/07/14 2348  I was present for the procedure of IV angiocath placement by Dr Fuller SongBritalik  [place] R. Rubin PayorPickering, MD 05/21/14 [phone]

## 2014-05-07 NOTE — Progress Notes (Signed)
ANTIBIOTIC CONSULT NOTE - INITIAL  Pharmacy Consult for Imipenem/Cilastatin Indication: UTI  Allergies  Allergen Reactions  . Sulfa Antibiotics Other (See Comments)    unknown  . Vancomycin Other (See Comments)    unknown  . Vioxx [Rofecoxib] Other (See Comments)    unknown  . Zyvox [Linezolid] Other (See Comments)    unknown    Patient Measurements: Height: 6' (182.9 cm) Weight: 219 lb (99.338 kg) IBW/kg (Calculated) : 77.6  Vital Signs: Temp: 97.2 F (36.2 C) (10/20 1548) Temp Source: Axillary (10/20 1548) BP: 93/43 mmHg (10/20 1701) Pulse Rate: 94 (10/20 1701) Intake/Output from previous day: 10/19 0701 - 10/20 0700 In: -  Out: 1100 [Urine:1100] Intake/Output from this shift: Total I/O In: 335 [Blood:335] Out: 400 [Urine:400]  Labs:  Recent Labs  05/06/14 1936 05/07/14 0213 05/07/14 1000 05/07/14 1724  WBC 11.3* 9.5 11.4* 10.7*  HGB 8.1* 6.8* 8.5* 7.9*  PLT 185 179 177 177  CREATININE 4.68* 4.51*  --   --    Estimated Creatinine Clearance: 13.6 ml/min (by C-G formula based on Cr of 4.51). No results found for this basename: VANCOTROUGH, Leodis BinetVANCOPEAK, VANCORANDOM, GENTTROUGH, GENTPEAK, GENTRANDOM, TOBRATROUGH, TOBRAPEAK, TOBRARND, AMIKACINPEAK, AMIKACINTROU, AMIKACIN,  in the last 72 hours   Microbiology: Recent Results (from the past 720 hour(s))  MRSA PCR SCREENING     Status: Abnormal   Collection Time    05/07/14 12:35 PM      Result Value Ref Range Status   MRSA by PCR POSITIVE (*) NEGATIVE Final   Comment:            The GeneXpert MRSA Assay (FDA     approved for NASAL specimens     only), is one component of a     comprehensive MRSA colonization     surveillance program. It is not     intended to diagnose MRSA     infection nor to guide or     monitor treatment for     MRSA infections.     RESULT CALLED TO, READ BACK BY AND VERIFIED WITH:     WELLINGTON RN 14:05 05/07/14 ([email or web address])    Medical History: Past Medical History  Diagnosis  Date  . COPD (chronic obstructive pulmonary disease)   . Anemia   . Renal disorder   . Dysphasia   . Tracheostomy dependent   . Diabetes mellitus without complication   . CHF (congestive heart failure)     Medications:  Prescriptions prior to admission  Medication Sig Dispense Refill  . Carboxymethylcellulose Sodium (REFRESH TEARS OP) Place 1 drop into both eyes every 6 (six) hours as needed (dry eyes).      . chlorhexidine (PERIDEX) 0.12 % solution Use as directed 15 mLs in the mouth or throat 2 (two) times daily.      Marland Kitchen. dexamethasone (DECADRON) 4 MG tablet 4 mg by PEG Tube route daily.      . diphenhydrAMINE (BENADRYL) 12.5 MG/5ML liquid Take 25 mg by mouth every 6 (six) hours as needed (reaction).      Marland Kitchen. docusate (COLACE) 50 MG/5ML liquid 100 mg by PEG Tube route 2 (two) times daily.      Marland Kitchen. ERTAPENEM SODIUM IV Inject 500 mg into the vein daily. For infections for 2 weeks      . famotidine (PEPCID) 20 MG tablet 20 mg by PEG Tube route 2 (two) times daily.      . insulin aspart (NOVOLOG) 100 UNIT/ML injection Inject 5-20 Units into the skin every  6 (six) hours as needed for high blood sugar. 200-300=5 units 301-400=10 units 401-500=15 units If greater than 500 give 20 units and call MD.      . insulin detemir (LEVEMIR) 100 UNIT/ML injection Inject 10-30 Units into the skin at bedtime. 30 units in the morning and 10 units at bedtime.      Marland Kitchen. ipratropium-albuterol (DUONEB) 0.5-2.5 (3) MG/3ML SOLN Take 3 mLs by nebulization every 6 (six) hours.      . Multiple Vitamins-Minerals (MULTIVITAMIN WITH MINERALS) tablet 1 tablet by PEG Tube route daily.      . NON FORMULARY Enteral feed. If residuals are greater than 250ml hold feeding. Elevate head of bed 30-45 degrees during feeding and 30-60 mins after.      . Nutritional Supplements (NOVASOURCE RENAL PO) 300 mLs by PEG Tube route every 6 (six) hours.      . senna (SENOKOT) 8.6 MG tablet 1 tablet by PEG Tube route daily.      . sodium  chloride 0.45 % Inject 50 mLs into the vein 2 (two) times daily. 3450ml/hr      . Sodium Chloride Flush (SALINE FLUSH IV) 15 mLs by Feeding Tube route See admin instructions. Every shift flush before and after medications and between each individual medication.      . tamsulosin (FLOMAX) 0.4 MG CAPS capsule 0.4 mg by PEG Tube route daily.      . vitamin C (ASCORBIC ACID) 500 MG tablet 500 mg by PEG Tube route daily.       Scheduled:  . chlorhexidine  15 mL Mouth/Throat BID  . [START ON 05/08/2014] Chlorhexidine Gluconate Cloth  6 each Topical Q0600  . dexamethasone  4 mg Per Tube Daily  . famotidine  20 mg Per Tube BID  . free water  250 mL Per Tube Q6H  . insulin aspart  0-9 Units Subcutaneous 6 times per day  . insulin detemir  30 Units Subcutaneous Daily   And  . insulin detemir  10 Units Subcutaneous QHS  . ipratropium-albuterol  3 mL Nebulization Q6H  . multivitamin with minerals  1 tablet Per Tube Daily  . mupirocin ointment  1 application Nasal BID  . pantoprazole (PROTONIX) IV  40 mg Intravenous Q12H  . senna  1 tablet Per Tube Daily  . sodium chloride  3 mL Intravenous Q12H  . tamsulosin  0.4 mg Oral Daily  . vitamin C  500 mg Per Tube Daily   Infusions:  . sodium chloride 100 mL/hr at 05/07/14 1529  . feeding supplement (NEPRO CARB STEADY)     Assessment: 80[facility] Pharmacy is consulted to dose imipenem/cilastatin for multi-drug resistant E.coli and enterococcal complicated UTI. Pt has been on ertapenem at outside hospital. Pt is afebrile, WBC wnl, CrCl ~ 10 mL/min.  Goal of Therapy:  Resolution of infection  Plan:  Imipenem/cilastatin 250mg  IV q12h for 3 days Follow up culture results and renal function  Arlean Hoppingorey M. Newman PiesBall, PharmD Clinical Pharmacist Pager [phone] 05/07/2014,5:48 PM

## 2014-05-07 NOTE — Progress Notes (Signed)
Patient ZO:[id number]:Shawn Lamb      DOB: July 11, 1923      JWJ:[id number]  Daughters [place] going home to [place] to get clothing to stay over night.  Requested meeting at 2pm tomorrow to allow other family to arrive    GOC 10/21 at  2 pm   Tynia Wiers L. Ladona Ridgelaylor, MD MBA The Palliative Medicine Team at [facility] Team Phone: [phone] Pager: [phone] ( Use team phone after hours)

## 2014-05-08 ENCOUNTER — Inpatient Hospital Stay (HOSPITAL_COMMUNITY): Payer: Medicare Other

## 2014-05-08 DIAGNOSIS — J42 Unspecified chronic bronchitis: Secondary | ICD-10-CM

## 2014-05-08 DIAGNOSIS — R4182 Altered mental status, unspecified: Secondary | ICD-10-CM

## 2014-05-08 LAB — CBC
HCT: 25.9 % — ABNORMAL LOW (ref 39.0–52.0)
HEMATOCRIT: 24 % — AB (ref 39.0–52.0)
HEMATOCRIT: 28.7 % — AB (ref 39.0–52.0)
Hemoglobin: 7.9 g/dL — ABNORMAL LOW (ref 13.0–17.0)
Hemoglobin: 8.4 g/dL — ABNORMAL LOW (ref 13.0–17.0)
Hemoglobin: 9.1 g/dL — ABNORMAL LOW (ref 13.0–17.0)
MCH: 27.2 pg (ref 26.0–34.0)
MCH: 26.7 pg (ref 26.0–34.0)
MCH: 26.6 pg (ref 26.0–34.0)
MCHC: 32.9 g/dL (ref 30.0–36.0)
MCHC: 32.4 g/dL (ref 30.0–36.0)
MCHC: 31.7 g/dL (ref 30.0–36.0)
MCV: 82.8 fL (ref 78.0–100.0)
MCV: 82.2 fL (ref 78.0–100.0)
MCV: 83.9 fL (ref 78.0–100.0)
PLATELETS: 228 10*3/uL (ref 150–400)
Platelets: 172 10*3/uL (ref 150–400)
Platelets: 73 10*3/uL — ABNORMAL LOW (ref 150–400)
RBC: 2.9 MIL/uL — ABNORMAL LOW (ref 4.22–5.81)
RBC: 3.15 MIL/uL — AB (ref 4.22–5.81)
RBC: 3.42 MIL/uL — ABNORMAL LOW (ref 4.22–5.81)
RDW: 15.1 % (ref 11.5–15.5)
RDW: 15.4 % (ref 11.5–15.5)
RDW: 15.5 % (ref 11.5–15.5)
WBC: 10 10*3/uL (ref 4.0–10.5)
WBC: 10.3 10*3/uL (ref 4.0–10.5)
WBC: 10.2 10*3/uL (ref 4.0–10.5)

## 2014-05-08 LAB — BASIC METABOLIC PANEL
ANION GAP: 16 — AB (ref 5–15)
BUN: 123 mg/dL — ABNORMAL HIGH (ref 6–23)
CO2: 22 meq/L (ref 19–32)
Calcium: 9.1 mg/dL (ref 8.4–10.5)
Chloride: 102 mEq/L (ref 96–112)
Creatinine, Ser: 3.84 mg/dL — ABNORMAL HIGH (ref 0.50–1.35)
GFR calc non Af Amer: 13 mL/min — ABNORMAL LOW (ref >90)
GFR, EST AFRICAN AMERICAN: 15 mL/min — AB (ref >90)
Glucose, Bld: 151 mg/dL — ABNORMAL HIGH (ref 70–99)
POTASSIUM: 4.3 meq/L (ref 3.7–5.3)
Sodium: 140 mEq/L (ref 137–147)

## 2014-05-08 LAB — GLUCOSE, CAPILLARY
GLUCOSE-CAPILLARY: 153 mg/dL — AB (ref 70–99)
Glucose-Capillary: 125 mg/dL — ABNORMAL HIGH (ref 70–99)
Glucose-Capillary: 168 mg/dL — ABNORMAL HIGH (ref 70–99)
Glucose-Capillary: 171 mg/dL — ABNORMAL HIGH (ref 70–99)
Glucose-Capillary: 49 mg/dL — ABNORMAL LOW (ref 70–99)
Glucose-Capillary: 73 mg/dL (ref 70–99)
Glucose-Capillary: 74 mg/dL (ref 70–99)
Glucose-Capillary: 82 mg/dL (ref 70–99)

## 2014-05-08 LAB — URINE CULTURE: Colony Count: >=100000

## 2014-05-08 MED ORDER — SODIUM CHLORIDE 0.9 % IJ SOLN: 10.0000 mL | INTRAMUSCULAR | Status: DC | PRN

## 2014-05-08 MED ORDER — SODIUM CHLORIDE 0.9 % IV BOLUS (SEPSIS)
500.0000 mL | Freq: Once | INTRAVENOUS | Status: AC
  Administered 2014-05-08: 500 mL via INTRAVENOUS

## 2014-05-08 MED ORDER — CETYLPYRIDINIUM CHLORIDE 0.05 % MT LIQD
7.0000 mL | Freq: Four times a day (QID) | OROMUCOSAL | Status: DC
  Administered 2014-05-08 – 2014-05-15 (×27): 7 mL via OROMUCOSAL

## 2014-05-08 MED ORDER — CHLORHEXIDINE GLUCONATE 0.12 % MT SOLN
15.0000 mL | Freq: Two times a day (BID) | OROMUCOSAL | Status: DC
  Administered 2014-05-08: 15 mL via OROMUCOSAL
  Filled 2014-05-08 (×3): qty 15

## 2014-05-08 MED ORDER — SODIUM CHLORIDE 0.9 % IJ SOLN
10.0000 mL | Freq: Two times a day (BID) | INTRAMUSCULAR | Status: DC
  Administered 2014-05-09: 10 mL
  Administered 2014-05-09: 30 mL
  Administered 2014-05-09: 13 mL
  Administered 2014-05-10 – 2014-05-11 (×4): 10 mL
  Administered 2014-05-12: 20 mL
  Administered 2014-05-12: 30 mL
  Administered 2014-05-13 – 2014-05-15 (×4): 10 mL

## 2014-05-08 MED ORDER — FAMOTIDINE 20 MG PO TABS: 20.0000 mg | ORAL_TABLET | Freq: Every day | ORAL | Status: DC

## 2014-05-08 MED ORDER — HEPARIN SODIUM (PORCINE) 5000 UNIT/ML IJ SOLN
5000.0000 [IU] | Freq: Three times a day (TID) | INTRAMUSCULAR | Status: DC
  Administered 2014-05-08 – 2014-05-10 (×6): 5000 [IU] via SUBCUTANEOUS
  Filled 2014-05-08 (×8): qty 1

## 2014-05-08 MED ORDER — DEXTROSE 50 % IV SOLN
INTRAVENOUS | Status: AC
  Administered 2014-05-08: 50 mL
  Filled 2014-05-08: qty 50

## 2014-05-08 NOTE — Consult Note (Signed)
Patient MC:[id number] Candy      DOB: [birth year]      PCH:[id number]  Summary of Goals of Care; full note to follow   200 pm-250 pm  Met with two daughters who are the patients surrogate decision makers- [name] and [name].  [name] has lived with her father in her home for over 30 yrs, and is having the most trouble with facing loosing him.   The girls reflected on how well he initially did at rehab, they were very hopefull that he would be able to wean and come home with them.  He developed a UTI and was being treated their for VRE, also testing positive for MRSA.   Daughters state that he has told the doctors in the past his desire is to live as long as he can . I talked with them about what that might entail for him and asked them to talk with each other about possibly considering compassionate choice for DNR but permit treating the treatable as a way to honor his dignity.  They told me that he has a voice like Nat Maryjo [place], and was a very loving dad and grandfather.   We then went to the Chapl[facility] office to try to arrange housing at the [facility] would like to try to get him better enough to travel [facility] to a facility that they had a bed at before. [facility] in [place].    Updated Care Manager and attending team   [place]. Lovena Le, MD MBA The Palliative Medicine Team at [facility] Phone: [phone] Pager: [phone] ( Use team phone after hours)

## 2014-05-08 NOTE — Progress Notes (Signed)
Peripherally Inserted Central Catheter/Midline Placement  The IV Nurse has discussed with the patient and/or persons authorized to consent for the patient, the purpose of this procedure and the potential benefits and risks involved with this procedure.  The benefits include less needle sticks, lab draws from the catheter and patient may be discharged home with the catheter.  Risks include, but not limited to, infection, bleeding, blood clot (thrombus formation), and puncture of an artery; nerve damage and irregular heat beat.  Alternatives to this procedure were also discussed.  PICC/Midline Placement Documentation  PICC Triple Lumen 05/06/14 PICC Right (Active)  Lumen #1 Status Saline locked;No blood return 05/08/2014  1:00 PM  Lumen #2 Status Saline locked;No blood return 05/08/2014  1:00 PM  Lumen #3 Status Saline locked;No blood return 05/08/2014  1:00 PM  Dressing Type Transparent 05/08/2014  1:00 PM  Dressing Status Dry;Intact 05/07/2014  8:00 PM  Dressing Intervention Dressing changed 05/07/2014  9:44 PM  Dressing Change Due 05/14/14 05/07/2014  9:44 PM     PICC Triple Lumen 05/08/14 PICC Left Brachial 46 cm 0 cm (Active)  Indication for Insertion or Continuance of Line Prolonged intravenous therapies 05/08/2014  8:56 PM  Exposed Catheter (cm) 0 cm 05/08/2014  8:56 PM  Site Assessment Bleeding;Clean;Intact 05/08/2014  8:56 PM  Dressing Type Gauze;Occlusive 05/08/2014  8:56 PM  Dressing Status Clean;Dry;Intact;Antimicrobial disc in place 05/08/2014  8:56 PM  Dressing Change Due 05/15/14 05/08/2014  8:56 PM       Poff, Deatra RobinsonJessica Ann 05/08/2014, 8:58 PM

## 2014-05-08 NOTE — Progress Notes (Signed)
Pt remains on full support at this time tolerating well. Pt is from [facility] and unsure if pt is vent dependant/able to wean. No wean at this time- unsure what plan is. Will wait to speak with MD ? Weaning. RT will continue to monitor.

## 2014-05-08 NOTE — Progress Notes (Addendum)
[facility]    Date of Admission:  05/06/2014   Total days of antibiotics 3        Day 2 Imipenem.           ID: Shawn Lamb is a 80 y.o. male with  PMH of COPD, Trach- vent dependent, CHF, DM. [facility] where he was treated with etapenem for UTI- VRE. ceftraizone started on admission here, and d/c due to rash.  Principal Problem:   Sepsis secondary to UTI Active Problems:   UTI (lower urinary tract infection)   COPD (chronic obstructive pulmonary disease)   Anemia   Tracheostomy dependent   Diabetes mellitus without complication   CHF (congestive heart failure)   Acute encephalopathy  Subjective: Awake, slightly drowsy, follows most commands. Might be back to his baseline. Trach, on vent. No events overnight. Palliative to meet with family.  Medications:  . antiseptic oral rinse  7 mL Mouth Rinse QID  . chlorhexidine  15 mL Mouth/Throat BID  . chlorhexidine  15 mL Mouth Rinse BID  . Chlorhexidine Gluconate Cloth  6 each Topical Q0600  . dexamethasone  4 mg Per Tube Daily  . famotidine  20 mg Per Tube BID  . free water  250 mL Per Tube Q6H  . imipenem-cilastatin  250 mg Intravenous Q12H  . insulin aspart  0-9 Units Subcutaneous 6 times per day  . insulin detemir  30 Units Subcutaneous Daily   And  . insulin detemir  10 Units Subcutaneous QHS  . ipratropium-albuterol  3 mL Nebulization Q6H  . multivitamin with minerals  1 tablet Per Tube Daily  . mupirocin ointment  1 application Nasal BID  . pantoprazole (PROTONIX) IV  40 mg Intravenous Q12H  . senna  1 tablet Per Tube Daily  . sodium chloride  3 mL Intravenous Q12H  . tamsulosin  0.4 mg Oral Daily  . vitamin C  500 mg Per Tube Daily    Objective: Vital signs in last 24 hours: Temp:  [96.2 F (35.7 C)-99 F (37.2 C)] 96.2 F (35.7 C) (10/21 0742) Pulse Rate:  [81-97] 81 (10/21 0409) Resp:  [13-21] 14 (10/21 0409) BP: (87-132)/(35-66) 96/63 mmHg (10/21  0409) SpO2:  [95 %-100 %] 100 % (10/21 0745) FiO2 (%):  [30 %-60 %] 30 % (10/21 0745) Weight:  [219 lb (99.338 kg)] 219 lb (99.338 kg) (10/20 1325)  Physical Exam-  Head: Normocephalic, without obvious abnormality, atraumatic Resp: rhonchi anterior - bilateral and bilaterally Cardio: irregularly irregular rhythm and no rub GI: soft, non-tender; bowel sounds normal; no masses,  no organomegaly Extremities: extremities normal, atraumatic, appears edematous, slightly pitting. Neurologic: Moves upper extremities spontaneously, open eyes to voice.  Lab Results  Recent Labs  05/07/14 0213  05/07/14 2150 05/08/14 0258  WBC 9.5  < > 10.8* 10.0  HGB 6.8*  < > 8.0* 7.9*  HCT 21.3*  < > 24.7* 24.0*  NA 133*  --   --  140  K 4.6  --   --  4.3  CL 95*  --   --  102  CO2 23  --   --  22  BUN 126*  --   --  123*  CREATININE 4.51*  --   --  3.84*  < > = values in this interval not displayed. Liver Panel  Recent Labs  05/07/14 0213  PROT 6.1  ALBUMIN 1.7*  AST 19  ALT 29  ALKPHOS 108  BILITOT  0.3   Microbiology: Urine Cultures- 10/19 , NGTD, >> Blood cultures X2 - 10/19, NGTD, >>  Studies/Results: [place] Renal  05/07/2014   CLINICAL DATA:  Acute kidney disease. Hematuria. Urinary retention.  EXAM: RENAL/URINARY TRACT ULTRASOUND COMPLETE  COMPARISON:  None.  FINDINGS: Right Kidney:  Length: 10.3 cm. Thinning of renal parenchyma with diffuse increased echotexture consistent with atrophy and medical renal disease. No hydronephrosis or solid mass identified.  Left Kidney:  Length: 10.7 cm. Mild diffuse increased parenchymal echotexture suggesting medical renal disease. No hydronephrosis or solid mass identified.  Bladder:  Bladder is decompressed with a Foley catheter and cannot be evaluated.  Incidental mode of sludge in the gallbladder. No discrete stone or wall thickening is appreciated.  IMPRESSION: No hydronephrosis in either kidney. Increased renal parenchymal echotexture bilaterally  consistent with chronic medical renal disease.   Electronically Signed   By: Burman NievesWilliam  Stevens M.D.   On: 05/07/2014 03:30   Dg Chest Portable 1 View  05/06/2014   CLINICAL DATA:  Fever.  EXAM: PORTABLE CHEST - 1 VIEW  COMPARISON:  None.  FINDINGS: The heart size and mediastinal contours are within normal limits. Tracheostomy is in grossly good position. Right-sided PICC line is noted with tip in the expected position of right axillary vein. No pneumothorax or significant pleural effusion is noted. Calcified pleural plaques are seen bilaterally suggesting asbestos exposure. No acute pulmonary disease is noted. The visualized skeletal structures are unremarkable.  IMPRESSION: Right-sided PICC line tip seen in expected position of right axillary vein. Tracheostomy tube in grossly good position. Calcified pleural plaques suggesting asbestos exposure.   Electronically Signed   By: Roque LiasJames  Green M.D.   On: 05/06/2014 21:33   Antibiotics-   On ertapenem prior to admission Ceftriazone- 05/07/2014 . impenem- 10/20/ >>   Assessment/Plan: Sepsis 2/2 UTI  AMS likely 2/2 uremia not sepsis, UTI likely clear with prior antibiotics, recommend Continuing imipenem 3 more days then stop.  Inocente SallesEjiro Emokpae, MD PGY-2 IMTS [phone].  05/08/2014, 9:17 AM    I have discussed assessment and plan with Dr. Mariea ClontsEmokpae and concur with plan of treatment for UTI with 3 additional days of imipenem. Encouraged that he is improving. Will follow up on culture collected during this hospitalization.  Duke Salviaynthia B. Drue SecondSnider MD MPH [facility] [phone]

## 2014-05-08 NOTE — Consult Note (Addendum)
WOC wound consult note Reason for Consult: Consult requested for buttocks.  Pt is frequently incontinent and it is difficult to keep wound from becoming soiled.  Red, moist, macerated skin to entire buttocks and sacrum; appearance consistent with moisture associated skin damage.  Patchy areas of dry peeling skin surrounding outer buttocks.  Wound type: Inner buttocks near gluteal fold with 2 wounds; stage 2 1X1X.1cm, pink and moist, and left buttock with stage 3 wound 2X2X.2cm, red and moist, both without odor or drainage.   Pressure Ulcer POA: Yes Dressing procedure/placement/frequency: Family states that [facility] has been using Silver cream to affected areas and this has improved the appearance.  Informed them that we do not have this topical treatment in the [facility] formulary and they prefer to drive to [facility] and pick up the cream for continued use; they will leave it at the bedside. Discussed plan of care with daughters; pt is on an air mattress to decrease pressure to affected areas and increase airflow, but it is difficult to promote healing R/T frequent incontinence and stool is too thick for a Flexiseal.  They verbalize understanding of the barriers to healing and the plan of care using the cream as they prefer. Please re-consult if further assistance is needed.  Thank-you,  Cammie Mcgeeawn Gardenia Witter MSN, RN, CWOCN, [place]AP, CNS [phone]

## 2014-05-08 NOTE — Progress Notes (Addendum)
PICC line assessed on the right arm. The PICC line appears to be pulled out under the dressing, according to the xray the tip of the PICC is neither in the SVC or CAJ. I advised the nurse to get an order to place another PICC line.  States she will talk to the MD. The current PICC can not be exchanged due to it being placed at another facility.  Consuello Masseimmons, Rafal Archuleta M

## 2014-05-08 NOTE — Progress Notes (Signed)
Patient'f glucose 49 at 13:30. 12.5 grams Dextrose 50% given.

## 2014-05-08 NOTE — Progress Notes (Signed)
CRITICAL VALUE ALERT  Critical value received:  CBG=49 mg/dl  Date of notification: 05/08/2014   Time of notification: 1339  Critical value read back:yes  Nurse who received alert:  Shawn Dexterata Monya Kozakiewicz, RN  MD notified (1st page):  Ms. Shawn SilkAllison Ellis, NP  Time of first page:  1430  MD notified (2nd page):  Time of second page:  Responding MD:  Ms. Shawn SilkAllison Ellis, NP  Time MD responded:  1430

## 2014-05-08 NOTE — Progress Notes (Signed)
Shawn Lamb - Stepdown / ICU Progress Note  Shawn Lamb RJJ:[id number] DOB: [birth year] DOA: 05/06/2014 PCP: Verneita Griffes, MD  Brief narrative: 80 year old male patient with known history of COPD, dementia and diabetes. Resident of [facility] facility secondary to chronic tracheostomy. He also has a chronic Foley catheter. He was sent to the emergency department because of altered mentation and apparent urinary retention. The nursing staff there had noticed decreased output and blood clots in the Foley catheter with associated altered mentation. This patient apparently has a history of recurrent UTI and was being treated for VRE UTI with ertapenem. According to the records from [place] he also has had progressive acute renal failure with severe azotemia.  Upon arrival to [facility] he was found to have a leukocytosis of 11,300. His creatinine had increased from a baseline of Lamb.6 to 4.8 and he was tachycardic. He also had azotemia with BUN of 117.  Since admission he has undergone IV fluid hydration with some improvement in his renal function although this has not returned to baseline. Infectious disease service has been consulted and suspects reason for altered mentation secondary to acute urinary retention and resultant renal failure but they do not suspect patient has acute UTI and his picture is more consistent with colonization. Patient's overall long-term prognosis is poor and palliative medicine has a meeting planned with the patient's daughters at 2 PM on 10/21.  Today patient has had variable blood pressure readings primarily in the low normal range requiring IV fluid bolus and increase IV fluid rate.   HPI/Subjective: Patient awake and able to not appropriately to questions asked. When asked if he felt funny when blood pressure was low he nodded yes.  Assessment/Plan:    Sepsis secondary to UTI Treating acidosis related to UTI but based on infectious disease assessment  current pattern more consistent with colonization-ID recommended continuing imipenem for an additional 3-4 days to cover drug-resistant Escherichia coli-current urine and blood cultures pending-continue supportive care  **10/21 Family has met with PALLIATIVE TEAM  And request to continue ALL forms of aggressive care    Tracheostomy dependent/COPD  Currently on full ventilatory support but oxygen weaned to 30%   Hypotension Fluid resuscitate-discontinue Flomax    Acute renal failure on chronic kidney disease (can't determine chronic stage from info available) Baseline renal function based on limited documentation from [facility] shows best renal function BUN 80 with creatinine Lamb.6-peak BUN this admission 166 with peak creatinine 4.68-current trend is down but has not returned to baseline and seems more consistent with volume depletion-given hypotension earlier today we'll bolus with 500 cc normal saline and increase IV fluids to 150 cc per hour-also receiving free water per tube    Anemia Iron slightly low, ferritin mildly elevated and folate greater than 20. B12 728-follow after hydration    Diabetes mellitus without complication CBGs much better control noting in the previous 24 hours have been running in the 300s-hemoglobin A1c 7.4-continue Levemir and sliding scale insulin   Dysphagia tube feeding per PEG    History of unknown type CHF (no TTE available) Actually appears dry and not exacerbated    Acute encephalopathy Seems to be resolving noting patient nodding appropriately to questions asked   DVT prophylaxis: Subcutaneous heparin Code Status: Full Family Communication: No family at bedside Disposition Plan/Expected LOS: Stepdown-eventual return to [facility] pending palliative evaluation  Consultants: Palliative medicine Infectious disease  Procedures: PICC line right upper extremity (which was placed prior to admission) nonfunctional and was discontinued  10/21  Cultures: Blood cultures pending Culture pending  Antibiotics: Ceftriaxone x1 dose Ertapenem x1 dose Imipenem 10/20 >>>  Objective: Blood pressure 102/39, pulse 82, temperature 96.2 F (35.7 C), temperature source Axillary, resp. rate 18, height 6' (Lamb.829 m), weight 219 lb (99.338 kg), SpO2 99.00%.  Intake/Output Summary (Last 24 hours) at 05/08/14 1309 Last data filed at 05/08/14 1147  Gross per 24 hour  Intake      3 ml  Output   1950 ml  Net  -1947 ml   Exam: Gen: No acute respiratory distress Chest: no wheezes appreciated - mild basilar crackles  Cardiac: Regular rate and rhythm, S1-S2, no rubs murmurs or gallops, no peripheral edema Abdomen: Soft nontender nondistended without obvious hepatosplenomegaly, no ascites-PEG tube with tube feeding infusing Extremities: Symmetrical in appearance without cyanosis, clubbing or effusion  Scheduled Meds:  Scheduled Meds: . antiseptic oral rinse  7 mL Mouth Rinse QID  . chlorhexidine  15 mL Mouth/Throat BID  . chlorhexidine  15 mL Mouth Rinse BID  . Chlorhexidine Gluconate Cloth  6 each Topical Q0600  . dexamethasone  4 mg Per Tube Daily  . [START ON 05/09/2014] famotidine  20 mg Per Tube Daily  . free water  250 mL Per Tube Q6H  . imipenem-cilastatin  250 mg Intravenous Q12H  . insulin aspart  0-9 Units Subcutaneous 6 times per day  . insulin detemir  30 Units Subcutaneous Daily   And  . insulin detemir  10 Units Subcutaneous QHS  . ipratropium-albuterol  3 mL Nebulization Q6H  . multivitamin with minerals  Lamb tablet Per Tube Daily  . mupirocin ointment  Lamb application Nasal BID  . pantoprazole (PROTONIX) IV  40 mg Intravenous Q12H  . senna  Lamb tablet Per Tube Daily  . sodium chloride  3 mL Intravenous Q12H  . tamsulosin  0.4 mg Oral Daily  . vitamin C  500 mg Per Tube Daily    Data Reviewed: Basic Metabolic Panel:  Recent Labs Lab 05/06/14 1936 05/07/14 0213 05/08/14 0258  NA 133* 133* 140  K 4.6 4.6 4.3   CL 93* 95* 102  CO2 $Re'24 23 22  'AqV$ GLUCOSE 268* 333* 151*  BUN 117* 126* 123*  CREATININE 4.68* 4.51* 3.84*  CALCIUM 9.3 8.8 9.Lamb   Liver Function Tests:  Recent Labs Lab 05/07/14 0213  AST 19  ALT 29  ALKPHOS 108  BILITOT 0.3  PROT 6.Lamb  ALBUMIN Lamb.7*   CBC:  Recent Labs Lab 05/06/14 1936  05/07/14 1000 05/07/14 1724 05/07/14 2150 05/08/14 0258 05/08/14 0923  WBC 11.3*  < > 11.4* 10.7* 10.8* 10.0 10.3  NEUTROABS 10.2*  --   --   --   --   --   --   HGB 8.Lamb*  < > 8.5* 7.9* 8.0* 7.9* 8.4*  HCT 25.4*  < > 26.0* 24.6* 24.7* 24.0* 25.9*  MCV 84.4  < > 83.Lamb 80.4 80.5 82.8 82.2  PLT 185  < > 177 177 178 172 228  < > = values in this interval not displayed.  Cardiac Enzymes:  Recent Labs Lab 05/07/14 0213 05/07/14 0753  TROPONINI <0.30 <0.30   BNP (last 3 results)  Recent Labs  05/07/14 0213 05/07/14 1317  PROBNP 3433.0* 2701.0*   CBG:  Recent Labs Lab 05/07/14 2001 05/08/14 0040 05/08/14 0305 05/08/14 0406 05/08/14 0741  GLUCAP 238* 168* 171* 153* 125*    Recent Results (from the past 240 hour(s))  URINE CULTURE     Status:  None   Collection Time    05/06/14  7:04 PM      Result Value Ref Range Status   Specimen Description URINE, CLEAN CATCH   Final   Special Requests NONE   Final   Culture  Setup Time     Final   Value: 05/06/2014 20:38     Performed at [facility] PENDING   Incomplete   Culture     Final   Value: Culture reincubated for better growth     Performed at [facility]   Report Status PENDING   Incomplete  CULTURE, BLOOD (ROUTINE X 2)     Status: None   Collection Time    05/06/14  8:52 PM      Result Value Ref Range Status   Specimen Description BLOOD ARM LEFT   Final   Special Requests BOTTLES DRAWN AEROBIC AND ANAEROBIC 10CC   Final   Culture  Setup Time     Final   Value: 05/07/2014 01:18     Performed at [facility]   Culture     Final   Value:        BLOOD CULTURE RECEIVED NO  GROWTH TO DATE CULTURE WILL BE HELD FOR 5 DAYS BEFORE ISSUING A FINAL NEGATIVE REPORT     Performed at [facility]   Report Status PENDING   Incomplete  CULTURE, BLOOD (ROUTINE X 2)     Status: None   Collection Time    05/06/14  9:00 PM      Result Value Ref Range Status   Specimen Description BLOOD HAND LEFT   Final   Special Requests BOTTLES DRAWN AEROBIC ONLY 8CC   Final   Culture  Setup Time     Final   Value: 05/07/2014 01:18     Performed at [facility]   Culture     Final   Value:        BLOOD CULTURE RECEIVED NO GROWTH TO DATE CULTURE WILL BE HELD FOR 5 DAYS BEFORE ISSUING A FINAL NEGATIVE REPORT     Performed at [facility]   Report Status PENDING   Incomplete  MRSA PCR SCREENING     Status: Abnormal   Collection Time    05/07/14 12:35 PM      Result Value Ref Range Status   MRSA by PCR POSITIVE (*) NEGATIVE Final   Comment:            The GeneXpert MRSA Assay (FDA     approved for NASAL specimens     only), is one component of a     comprehensive MRSA colonization     surveillance program. It is not     intended to diagnose MRSA     infection nor to guide or     monitor treatment for     MRSA infections.     RESULT CALLED TO, READ BACK BY AND VERIFIED WITH:     [place] RN 14:05 05/07/14 ([email or web address])     Studies:  Recent x-ray studies have been reviewed in detail by the Attending Physician  Time spent :  [place], ANP [facility] Office  [phone] Pager [phone]   **If unable to reach the above provider after paging please contact the [facility] @ [phone]  On-Call/Text Page:      [email or web address]      password [email or web address]  If 7PM-7AM, please  contact night-coverage [email or web address] Password [email or web address] 05/08/2014, Lamb:09 PM   LOS: 2 days   I have personally examined this patient and reviewed the entire database. I have reviewed the above note, made any necessary editorial changes, and agree with its  content.  Cherene Altes, MD [facility]

## 2014-05-08 NOTE — Progress Notes (Signed)
PT Cancellation Note  Patient Details Name: Shawn Lamb MRN: [id number] DOB: [birth year]   Cancelled Treatment:    Reason Eval/Treat Not Completed: Patient not medically ready.  Pt currently with Strict Bedrest order and noted plan for Palliative meeting 2pm today.  Please advance activity order if pt appropriate for mobility.     Noel Rodier, [name] 05/08/2014, 8:39 AM

## 2014-05-08 NOTE — Clinical Documentation Improvement (Signed)
Risk Factors: Acute on CKD per 10/20 progress notes.   Diagnostics: Bun: 10/21: 123. 10/19: 117. Creat:  10/21: 3.84. 10/19: 4.68. GFR: 10/21: 16. 10/19: 12.     Possible Clinical Conditions?   _______CKD Stage I - GFR > OR = 90 _______CKD Stage II - GFR 60-80 _______CKD Stage III - GFR 30-59 _______CKD Stage IV - GFR 15-29 _______CKD Stage V - GFR < 15 _______ESRD (End Stage Renal Disease) _______Other condition_____________ _______Cannot Clinically determine     Thank You, Marciano SequinWanda Mathews-Bethea,RN,BSN, Clinical Documentation Specialist:  [phone]  [facility]- Health Information Management

## 2014-05-08 NOTE — Progress Notes (Signed)
Patient's sacral and scrotum area is red.  Cream brought by family applied to sacral area as per family's request.

## 2014-05-08 NOTE — Progress Notes (Signed)
OT Cancellation Note  Patient Details Name: Shawn Lamb MRN: [id number] DOB: 15-Jul-1923   Cancelled Treatment:    Reason Eval/Treat Not Completed: Patient not medically ready. Awaiting update of activity orders and goals of care. Palliative Care meeting scheduled with family this afternoon.  [name], Shearon Clonch Lynn 05/08/2014, 9:24 AM

## 2014-05-08 NOTE — Clinical Documentation Improvement (Signed)
Risk Factors: CHF, ? diastolic per 10/20 progress notes.    Diagnostics: proBNP: 10/20 @ [id number]:  3433.0. 10/20 @ 1317:  2701.0.    Possible Clinical Conditions?  Acute Diastolic Congestive Heart Failure Chronic Diastolic Congestive Heart Failure Chronic Systolic & Diastolic Congestive Heart Failure Acute Systolic & Diastolic Congestive Heart Failure Acute on Chronic Diastolic Congestive Heart Failure Acute on Chronic Systolic & Diastolic Congestive Heart Failure Other Condition Cannot Clinically Determine    Thank You, Marciano SequinWanda Mathews-Bethea,RN,BSN, Clinical Documentation Specialist:  [phone]  [facility]- Health Information Management

## 2014-05-08 NOTE — Progress Notes (Signed)
Dear Doctor: This patient has been identified as a candidate for PICC for the following reason (s): poor veins/poor circulatory system (CHF, COPD, emphysema, diabetes, steroid use, IV drug abuse, etc.) If you agree, please write an order for the indicated device. For any questions contact the Vascular Access Team at [phone] if no answer, please leave a message.  Current PICC has been pulled out and can not be exchanged due to not being placed in a [facility] facility.   Thank you for supporting the early vascular access assessment program. Consuello Masseimmons, Shawntina Diffee M

## 2014-05-08 NOTE — Consult Note (Signed)
Patient Shawn Lamb      DOB: [birth year]      VFI:[id number]     Consult Note from the Palliative Medicine Team at [facility] Requested by: Dr.  Thereasa Solo    PCP: Verneita Griffes, MD Reason for Consultation: [place]     Phone [phone] Related symptom management issues Assessment of patients Current state: 80 yr old African american male admitted from [place] with AMS and urinary retention.  Patient was being treated for UTI and was found to have hematuria obstructing his foley. He was noted to have associated azotemia.  His diagnosis was sepsis more likely related to HCAP than UTI which was felt to represent chronic colonization.  He was treated for respiratory failure and currently is more awake and alert. I met with his tow daughters. Please see my note from the date of service. Both daughters assert full treatment and full code status. The patient's one daughter [name] lives with her father and broke down stating I can't live without him.  [name] seems to be more objective but still unrealistic about his ability to recover.  They tell me that he had been making some strides when he first arrived at [facility] and was up walking and was off the vent for a little while but then he declined.  They were resentful of other practitioners telling them that their father was dying . We talked about his current condition and that he was at high risk for death and recurrent infections. We talked about what it would look like if he a had an event requiring CPR and we tried to put that into the perspective of his life which was an active man with the voice of "Astrid Drafts".  The girls will not make any other decision than one for life even if it means life on a vent and PEG.   Goals of Care: 1.  Code Status: Full Code   2. Scope of Treatment: Continue full treatment of Acute and Chronic conditions.   4. Disposition: Daughters want to get him transferred back to  [place]. They have a brochure for [facility].   3. Symptom Management: Patient is comfortable at this time.  Family NOT interested in switching to comfort care.    4. Psychosocial:  Lived with Daughter [name] for many years prior to his current state,  5. Spiritual: Christian faith . Sang in [employer].        Patient Documents Completed or Given: Document Given Completed  Advanced Directives Pkt    MOST    DNR    Gone from My Sight    Hard Choices      Brief HPI: 80 yr old african [place] male transferred from [facility] with AMS found to have urinary retention.Patient had been moved to [facility] from his home state in [place] for vent weaning. We were asked to assist with goals of care.   ROS: unable to fully assess although he shakes no to pain or trouble breathing.    PMH:  Past Medical History  Diagnosis Date  . COPD (chronic obstructive pulmonary disease)   . Anemia   . Renal disorder   . Dysphasia   . Tracheostomy dependent   . Diabetes mellitus without complication   . CHF (congestive heart failure)      CZY:SAYTKZS reviewed. No pertinent past surgical history. I have reviewed the [place] and SH and  If appropriate update it with new information. Allergies  Allergen Reactions  . Sulfa Antibiotics Other (See Comments)    unknown  . Vancomycin Other (See Comments)    unknown  . Vioxx [Rofecoxib] Other (See Comments)    unknown  . Zyvox [Linezolid] Other (See Comments)    unknown   Scheduled Meds: . antiseptic oral rinse  7 mL Mouth Rinse QID  . chlorhexidine  15 mL Mouth/Throat BID  . chlorhexidine  15 mL Mouth Rinse BID  . Chlorhexidine Gluconate Cloth  6 each Topical Q0600  . dexamethasone  4 mg Per Tube Daily  . [START ON 05/09/2014] famotidine  20 mg Per Tube Daily  . free water  250 mL Per Tube Q6H  . heparin subcutaneous  5,000 Units Subcutaneous 3 times per day  . imipenem-cilastatin  250 mg Intravenous Q12H  . insulin aspart  0-9 Units  Subcutaneous 6 times per day  . insulin detemir  30 Units Subcutaneous Daily   And  . insulin detemir  10 Units Subcutaneous QHS  . ipratropium-albuterol  3 mL Nebulization Q6H  . multivitamin with minerals  1 tablet Per Tube Daily  . mupirocin ointment  1 application Nasal BID  . pantoprazole (PROTONIX) IV  40 mg Intravenous Q12H  . senna  1 tablet Per Tube Daily  . sodium chloride  3 mL Intravenous Q12H  . vitamin C  500 mg Per Tube Daily   Continuous Infusions: . sodium chloride 1,000 mL (05/08/14 1150)  . feeding supplement (NEPRO CARB STEADY) 1,000 mL (05/08/14 1117)   PRN Meds:.acetaminophen, acetaminophen, albuterol, diphenhydrAMINE, docusate, polyvinyl alcohol    BP 102/39  Pulse 82  Temp(Src) 96.2 F (35.7 C) (Axillary)  Resp 18  Ht 6' (1.829 m)  Wt 99.338 kg (219 lb)  BMI 29.70 kg/m2  SpO2 100%   PPS:  20-30%   Intake/Output Summary (Last 24 hours) at 05/08/14 1500 Last data filed at 05/08/14 1147  Gross per 24 hour  Intake      3 ml  Output   1550 ml  Net  -1547 ml    Physical Exam:  General: No acute distress on vent via trach. Opens eyes to gentle verbal and tactile stimuli.  HEENT:  PERRL, EOMI, anicteric, mmm, trach in place Chest:   Decreased with some basilar crackles, no wheezing CVS: regular , S1, S2 Abdomen: obese soft, not tender or distended Ext: trace to 1 edema, wiggles both feet Neuro:unable to fully assess cognition due to take in place. Patient seemingly answers yes and no questions appropriately but can't fully assess memory etc.  Labs: CBC    Component Value Date/Time   WBC 10.3 05/08/2014 0923   RBC 3.15* 05/08/2014 0923   RBC 2.65* 05/07/2014 0213   HGB 8.4* 05/08/2014 0923   HCT 25.9* 05/08/2014 0923   PLT 228 05/08/2014 0923   MCV 82.2 05/08/2014 0923   MCH 26.7 05/08/2014 0923   MCHC 32.4 05/08/2014 0923   RDW 15.4 05/08/2014 0923   LYMPHSABS 0.6* 05/06/2014 1936   MONOABS 0.5 05/06/2014 1936   EOSABS 0.1 05/06/2014  1936   BASOSABS 0.0 05/06/2014 1936      CMP     Component Value Date/Time   NA 140 05/08/2014 0258   K 4.3 05/08/2014 0258   CL 102 05/08/2014 0258   CO2 22 05/08/2014 0258   GLUCOSE 151* 05/08/2014 0258   BUN 123* 05/08/2014 0258   CREATININE 3.84* 05/08/2014 0258   CALCIUM 9.1 05/08/2014 0258   PROT 6.1 05/07/2014 1607  ALBUMIN 1.7* 05/07/2014 0213   AST 19 05/07/2014 0213   ALT 29 05/07/2014 0213   ALKPHOS 108 05/07/2014 0213   BILITOT 0.3 05/07/2014 0213   GFRNONAA 13* 05/08/2014 0258   GFRAA 15* 05/08/2014 0258    Chest Xray Reviewed/Impressions:  Right-sided PICC line tip seen in expected position of right axillary vein. Tracheostomy tube in grossly good position. Calcified pleural plaques suggesting asbestos exposure.    Renal Ultrasound: No hydronephrosis in either kidney. Increased renal parenchymal echotexture bilaterally consistent with chronic medical renal disease  Time In Time Out Total Time Spent with Patient Total Overall Time  2 pm 250 pm 15 min 50 min    Greater than 50%  of this time was spent counseling and coordinating care related to the above assessment and plan.  Kendyl Festa L. Lovena Le, MD MBA The Palliative Medicine Team at [facility] Phone: [phone] Pager: [phone] ( Use team phone after hours)

## 2014-05-08 NOTE — Progress Notes (Signed)
R PICC D/C per MD order 42cm intact. PICC was placed PTA at another facility, no documented length. Appears intact with clean cut at the 42cm mark. Vaseline pressure dsg applied/pressure x5 min/no bleeding noted. Floor RN aware. Ronette DeterJessica Poff, RN IV Team

## 2014-05-09 DIAGNOSIS — J189 Pneumonia, unspecified organism: Secondary | ICD-10-CM

## 2014-05-09 DIAGNOSIS — N189 Chronic kidney disease, unspecified: Secondary | ICD-10-CM

## 2014-05-09 DIAGNOSIS — I9589 Other hypotension: Secondary | ICD-10-CM

## 2014-05-09 DIAGNOSIS — J9601 Acute respiratory failure with hypoxia: Secondary | ICD-10-CM

## 2014-05-09 DIAGNOSIS — E1165 Type 2 diabetes mellitus with hyperglycemia: Secondary | ICD-10-CM

## 2014-05-09 DIAGNOSIS — L8990 Pressure ulcer of unspecified site, unspecified stage: Secondary | ICD-10-CM

## 2014-05-09 DIAGNOSIS — R4702 Dysphasia: Secondary | ICD-10-CM

## 2014-05-09 LAB — CBC
HCT: 25.9 % — ABNORMAL LOW (ref 39.0–52.0)
Hemoglobin: 8.5 g/dL — ABNORMAL LOW (ref 13.0–17.0)
MCH: 27.5 pg (ref 26.0–34.0)
MCHC: 32.8 g/dL (ref 30.0–36.0)
MCV: 83.8 fL (ref 78.0–100.0)
Platelets: 206 10*3/uL (ref 150–400)
RBC: 3.09 MIL/uL — ABNORMAL LOW (ref 4.22–5.81)
RDW: 15.6 % — AB (ref 11.5–15.5)
WBC: 11.7 10*3/uL — AB (ref 4.0–10.5)

## 2014-05-09 LAB — GLUCOSE, CAPILLARY
GLUCOSE-CAPILLARY: 55 mg/dL — AB (ref 70–99)
GLUCOSE-CAPILLARY: 76 mg/dL (ref 70–99)
GLUCOSE-CAPILLARY: 106 mg/dL — AB (ref 70–99)
Glucose-Capillary: 105 mg/dL — ABNORMAL HIGH (ref 70–99)
Glucose-Capillary: 111 mg/dL — ABNORMAL HIGH (ref 70–99)
Glucose-Capillary: 188 mg/dL — ABNORMAL HIGH (ref 70–99)
Glucose-Capillary: 56 mg/dL — ABNORMAL LOW (ref 70–99)
Glucose-Capillary: 81 mg/dL (ref 70–99)

## 2014-05-09 LAB — BASIC METABOLIC PANEL
ANION GAP: 13 (ref 5–15)
BUN: 103 mg/dL — ABNORMAL HIGH (ref 6–23)
CALCIUM: 9.1 mg/dL (ref 8.4–10.5)
CO2: 22 meq/L (ref 19–32)
Chloride: 109 mEq/L (ref 96–112)
Creatinine, Ser: 3.09 mg/dL — ABNORMAL HIGH (ref 0.50–1.35)
GFR calc Af Amer: 19 mL/min — ABNORMAL LOW (ref >90)
GFR, EST NON AFRICAN AMERICAN: 16 mL/min — AB (ref >90)
Glucose, Bld: 72 mg/dL (ref 70–99)
Potassium: 4.1 mEq/L (ref 3.7–5.3)
SODIUM: 144 meq/L (ref 137–147)

## 2014-05-09 LAB — DIFFERENTIAL
Basophils Absolute: 0 10*3/uL (ref 0.0–0.1)
Basophils Relative: 0 % (ref 0–1)
EOS PCT: 8 % — AB (ref 0–5)
Eosinophils Absolute: 0.9 10*3/uL — ABNORMAL HIGH (ref 0.0–0.7)
LYMPHS PCT: 5 % — AB (ref 12–46)
Lymphs Abs: 0.5 10*3/uL — ABNORMAL LOW (ref 0.7–4.0)
MONO ABS: 0.9 10*3/uL (ref 0.1–1.0)
Monocytes Relative: 8 % (ref 3–12)
NEUTROS ABS: 9.2 10*3/uL — AB (ref 1.7–7.7)
Neutrophils Relative %: 79 % — ABNORMAL HIGH (ref 43–77)

## 2014-05-09 MED ORDER — DEXTROSE 50 % IV SOLN
INTRAVENOUS | Status: AC
  Administered 2014-05-09: 25 mL
  Filled 2014-05-09: qty 50

## 2014-05-09 MED ORDER — FLUCONAZOLE IN SODIUM CHLORIDE 200-0.9 MG/100ML-% IV SOLN
200.0000 mg | Freq: Once | INTRAVENOUS | Status: AC
  Administered 2014-05-09: 200 mg via INTRAVENOUS
  Filled 2014-05-09: qty 100

## 2014-05-09 MED ORDER — DEXTROSE 50 % IV SOLN
INTRAVENOUS | Status: AC
  Administered 2014-05-09: 05:00:00
  Filled 2014-05-09: qty 50

## 2014-05-09 MED ORDER — INSULIN DETEMIR 100 UNIT/ML ~~LOC~~ SOLN
10.0000 [IU] | Freq: Every day | SUBCUTANEOUS | Status: DC
  Administered 2014-05-09 – 2014-05-13 (×5): 10 [IU] via SUBCUTANEOUS
  Filled 2014-05-09 (×6): qty 0.1

## 2014-05-09 NOTE — Clinical Social Work Note (Signed)
CSW informed that family would like patient to go to [facility] [place].  CSW [facility] clinical [facility] to [facility] (fax: [phone]).    CSW will continue to follow.  Merlyn LotJenna Holoman, LCSWA Clinical Social Worker [phone]

## 2014-05-09 NOTE — Progress Notes (Signed)
CBG=111 mg/dl after [id number] was given

## 2014-05-09 NOTE — Progress Notes (Signed)
Inpatient Diabetes Program Recommendations  AACE/ADA: New Consensus Statement on Inpatient Glycemic Control (2013)  Target Ranges:  Prepandial:   less than 140 mg/dL      Peak postprandial:   less than 180 mg/dL (1-2 hours)      Critically ill patients:  140 - 180 mg/dL   Results for Shawn Lamb, Shawn Lamb (MRN [id number]) as of 05/09/2014 07:34  Ref. Range 05/08/2014 07:41 05/08/2014 13:32 05/08/2014 14:46 05/08/2014 16:49 05/08/2014 20:56 05/09/2014 00:26 05/09/2014 04:51 05/09/2014 05:12  Glucose-Capillary Latest Range: 70-99 mg/dL 782125 (H) 49 (L) 74 73 82 105 (H) 55 (L) 81    Diabetes history: DM2 Outpatient Diabetes medications: Levemir 30 units daily, Levemir 10 units QHS, Novolog 5-20 units Q6H Current orders for Inpatient glycemic control: Levemir 30 units daily, Levemir 10 units QHS, Novolog 0-9 units Q4H  Inpatient Diabetes Program Recommendations Insulin - Basal: Noted hypoglycemia yesterday and again this morning. Please consider decreasing Levemir to 10 units BID.  Thanks, Orlando PennerMarie Teyana Pierron, RN, MSN, CCRN Diabetes Coordinator Inpatient Diabetes Program [phone] (Team Pager) [phone] (AP office) [phone] [facility] office)

## 2014-05-09 NOTE — Progress Notes (Signed)
[name]  Bharath Bernstein MCN:[id number] DOB: 07-14-1923 DOA: 05/06/2014 PCP: Verneita Griffes, MD  Brief narrative: 80 year old male patient with known history of COPD, dementia and diabetes. Resident of [facility] facility secondary to chronic tracheostomy. He also has a chronic Foley catheter. He was sent to the emergency department because of altered mentation and apparent urinary retention. The nursing staff there had noticed decreased output and blood clots in the Foley catheter with associated altered mentation. This patient apparently has a history of recurrent UTI and was being treated for VRE UTI with ertapenem. According to the records from [facility] he also has had progressive acute renal failure with severe azotemia.  Upon arrival to [facility] he was found to have a leukocytosis of 11,300. His creatinine had increased from a baseline of 1.6 to 4.8 and he was tachycardic. He also had azotemia with BUN of 117.  Since admission he has undergone IV fluid hydration with some improvement in his renal function although this has not returned to baseline. Infectious disease service has been consulted and suspects reason for altered mentation secondary to acute urinary retention and resultant renal failure but they do not suspect patient has acute UTI and his picture is more consistent with colonization. Patient's overall long-term prognosis is poor and palliative medicine met with the patient's daughters on 10/21. At that time family opted to continue with aggressive medical interventions.  After rehydration patient has developed productive yellow-green sputum so sputum cultures been obtained. Urinalysis positive for greater than 100,000 colonies of yeast so a one-time dose of Diflucan 200 mg IV given. Renal function slowly improving with aggressive hydration.  HPI/Subjective: Patient awake and able to not appropriately to questions asked. When asked if he  felt funny when blood pressure was low he nodded yes.  Assessment/Plan:    Sepsis secondary to UTI vs suspectedHCAP Treating acidosis related to UTI but based on infectious disease assessment current pattern more consistent with colonization-ID recommended continuing imipenem for an additional 3-4 days to cover drug-resistant Escherichia coli-current urine and blood cultures pending-continue supportive care  **10/21 Family met with PALLIATIVE TEAM  And request to continue ALL forms of aggressive care    Acute respiratory failure with hypoxia due to presumed HCAP Since admission and rehydration patient has developed productive sputum yellow and green in color -tracheal aspirate culture obtained on 10/22 -portable chest x-ray on 10/21 with patchy areas hilar infiltrate on the right as well as right basilar haziness- currently on full ventilatory support but oxygen weaned to 30%-continue empiric Primaxin   Hypotension Resolved post fluid resuscitation-discontinued Flomax 10/21    Acute renal failure on chronic kidney disease (can't determine chronic stage from info available) Baseline renal function based on limited documentation from [facility] shows best renal function BUN 80 with creatinine 1.6-peak BUN this admission 166 with peak creatinine 4.68-as of 10/21 current trend was down but had not returned to baseline and seemed more consistent with volume depletion so was given fluid challenges and IV fluid rate increased-renal function continues to slowly improve and has excellent urinary output    Anemia Iron slightly low, ferritin mildly elevated and folate greater than 20. B12 728-follow after hydration    Diabetes mellitus without complication CBGs much better control noting in the previous 24 hours have been running in the 300s-hemoglobin A1c 7.4-continue Levemir and sliding scale insulin   Dysphagia tube feeding per PEG    History of unknown type CHF (no TTE available)  Actually appears  dry and not exacerbated    Acute encephalopathy Seems to be resolving noting patient nodding appropriately to questions asked   DVT prophylaxis: Subcutaneous heparin Code Status: Full Family Communication: No family at bedside Disposition Plan/Expected LOS: Stepdown-eventual return to [facility]  Consultants: Palliative medicine Infectious disease  Procedures: PICC line right upper extremity (which was placed prior to admission) nonfunctional and was discontinued 10/21  PICC line left upper arm  10/21  Cultures: Blood cultures pending Urine culture > 100,000 colonies yeast Tracheal aspirate culture pending  Antibiotics: Ceftriaxone x1 dose Ertapenem x1 dose Imipenem 10/20 >>> Diflucan IV x1 dose  Objective: Blood pressure 127/97, pulse 112, temperature 97.7 F (36.5 C), temperature source Oral, resp. rate 22, height 6' (1.829 m), weight 219 lb (99.338 kg), SpO2 97.00%.  Intake/Output Summary (Last 24 hours) at 05/09/14 1244 Last data filed at 05/09/14 1200  Gross per 24 hour  Intake   5303 ml  Output   1500 ml  Net   3803 ml   Exam: Gen: No acute respiratory distress-much more alert today Chest: Clear to auscultation except for somewhat diminished on the right base otherwise with expiratory rhonchi on right, trach tube to ventilator Cardiac: Regular rate and rhythm, S1-S2, no rubs murmurs or gallops, no peripheral edema Abdomen: Soft nontender nondistended without obvious hepatosplenomegaly, no ascites-PEG tube with tube feeding infusing Extremities: Symmetrical in appearance without cyanosis, clubbing or effusion  Scheduled Meds:  Scheduled Meds: . antiseptic oral rinse  7 mL Mouth Rinse QID  . chlorhexidine  15 mL Mouth/Throat BID  . Chlorhexidine Gluconate Cloth  6 each Topical Q0600  . dexamethasone  4 mg Per Tube Daily  . free water  250 mL Per Tube Q6H  . heparin subcutaneous  5,000 Units Subcutaneous 3 times per day  . imipenem-cilastatin  250 mg  Intravenous Q12H  . insulin aspart  0-9 Units Subcutaneous 6 times per day  . insulin detemir  10 Units Subcutaneous Daily  . ipratropium-albuterol  3 mL Nebulization Q6H  . multivitamin with minerals  1 tablet Per Tube Daily  . mupirocin ointment  1 application Nasal BID  . pantoprazole (PROTONIX) IV  40 mg Intravenous Q12H  . senna  1 tablet Per Tube Daily  . sodium chloride  10-40 mL Intracatheter Q12H  . sodium chloride  3 mL Intravenous Q12H  . vitamin C  500 mg Per Tube Daily    Data Reviewed: Basic Metabolic Panel:  Recent Labs Lab 05/06/14 1936 05/07/14 0213 05/08/14 0258 05/09/14 0400  NA 133* 133* 140 144  K 4.6 4.6 4.3 4.1  CL 93* 95* 102 109  CO2 _0 GLUCOSE 268* 333* 151* 72  BUN 117* 126* 123* 103*  CREATININE 4.68* 4.51* 3.84* 3.09*  CALCIUM 9.3 8.8 9.1 9.1   Liver Function Tests:  Recent Labs Lab 05/07/14 0213  AST 19  ALT 29  ALKPHOS 108  BILITOT 0.3  PROT 6.1  ALBUMIN 1.7*   CBC:  Recent Labs Lab 05/06/14 1936  05/07/14 2150 05/08/14 0258 05/08/14 0923 05/08/14 1600 05/09/14 0500  WBC 11.3*  < > 10.8* 10.0 10.3 10.2 11.7*  NEUTROABS 10.2*  --   --   --   --   --   --   HGB 8.1*  < > 8.0* 7.9* 8.4* 9.1* 8.5*  HCT 25.4*  < > 24.7* 24.0* 25.9* 28.7* 25.9*  MCV 84.4  < > 80.5 82.8 82.2 83.9 83.8  PLT 185  < >  178 172 228 73* 206  < > = values in this interval not displayed.  Cardiac Enzymes:  Recent Labs Lab 05/07/14 0213 05/07/14 0753  TROPONINI <0.30 <0.30   BNP (last 3 results)  Recent Labs  05/07/14 0213 05/07/14 1317  PROBNP 3433.0* 2701.0*   CBG:  Recent Labs Lab 05/08/14 2056 05/09/14 0026 05/09/14 0451 05/09/14 0512 05/09/14 0727  GLUCAP 82 105* 55* 81 76    Recent Results (from the past 240 hour(s))  URINE CULTURE     Status: None   Collection Time    05/06/14  7:04 PM      Result Value Ref Range Status   Specimen Description URINE, CLEAN CATCH   Final   Special Requests NONE   Final    Culture  Setup Time     Final   Value: 05/06/2014 20:38     Performed at [facility]     Final   Value: >=100,000 COLONIES/ML     Performed at [facility]   Culture     Final   Value: YEAST     Performed at [facility]   Report Status 05/08/2014 FINAL   Final  CULTURE, BLOOD (ROUTINE X 2)     Status: None   Collection Time    05/06/14  8:52 PM      Result Value Ref Range Status   Specimen Description BLOOD ARM LEFT   Final   Special Requests BOTTLES DRAWN AEROBIC AND ANAEROBIC 10CC   Final   Culture  Setup Time     Final   Value: 05/07/2014 01:18     Performed at [facility]   Culture     Final   Value:        BLOOD CULTURE RECEIVED NO GROWTH TO DATE CULTURE WILL BE HELD FOR 5 DAYS BEFORE ISSUING A FINAL NEGATIVE REPORT     Performed at [facility]   Report Status PENDING   Incomplete  CULTURE, BLOOD (ROUTINE X 2)     Status: None   Collection Time    05/06/14  9:00 PM      Result Value Ref Range Status   Specimen Description BLOOD HAND LEFT   Final   Special Requests BOTTLES DRAWN AEROBIC ONLY 8CC   Final   Culture  Setup Time     Final   Value: 05/07/2014 01:18     Performed at [facility]   Culture     Final   Value:        BLOOD CULTURE RECEIVED NO GROWTH TO DATE CULTURE WILL BE HELD FOR 5 DAYS BEFORE ISSUING A FINAL NEGATIVE REPORT     Performed at [facility]   Report Status PENDING   Incomplete  MRSA PCR SCREENING     Status: Abnormal   Collection Time    05/07/14 12:35 PM      Result Value Ref Range Status   MRSA by PCR POSITIVE (*) NEGATIVE Final   Comment:            The GeneXpert MRSA Assay (FDA     approved for NASAL specimens     only), is one component of a     comprehensive MRSA colonization     surveillance program. It is not     intended to diagnose MRSA     infection nor to guide or     monitor treatment for  MRSA infections.     RESULT CALLED TO, READ BACK BY AND  VERIFIED WITH:     [place] RN 14:05 05/07/14 ([email or web address])     Studies:  Recent x-ray studies have been reviewed in detail by the Attending Physician  Time spent :  [place], ANP [facility] Office  [phone] Pager [phone]   **If unable to reach the above provider after paging please contact the [facility] @ [phone]  On-Call/Text Page:      [email or web address]      password [email or web address]  If 7PM-7AM, please contact night-coverage [email or web address] Password [email or web address] 05/09/2014, 12:44 PM   LOS: 3 days   Examined patient discussed assessment and plan with ANP Ebony Hail and agree with above. Patient with multiple complex medical problems> 40 minutes spent in direct patient care

## 2014-05-09 NOTE — Progress Notes (Signed)
Hypoglycemic Event  CBG: 55  Treatment: D50 1/2 amp  Symptoms: none noted  Follow-up CBG: Time:5:12 AM  CBG Result: 81  Possible Reasons for Event:   Comments/MD notified:    Shawn Lamb, Alessandra Bevelsmanda Danielle  Remember to initiate Hypoglycemia Order Set & complete

## 2014-05-09 NOTE — Progress Notes (Signed)
[facility] for Infectious Disease    Date of Admission:  05/06/2014   Total days of antibiotics 4        Day 3 Imipenem.           ID: Shawn Lamb is a 80 y.o. male with  PMH of COPD, Trach- vent dependent, CHF, DM. [facility]ns[facility] form [facility] where he was treated with etapenem for UTI- VRE. ceftraizone started on admission here, and d/c due to rash. Presently on imipenem.   Principal Problem:   Sepsis secondary to UTI Active Problems:   UTI (lower urinary tract infection)   COPD (chronic obstructive pulmonary disease)   Anemia   Tracheostomy dependent   Diabetes mellitus without complication   CHF (congestive heart failure)   Acute encephalopathy  Subjective: More drowsy today, responds to voice and some commands. Intermittent hypothermia- 96.8 yesterday and 96.2  Today.   Medications:  . antiseptic oral rinse  7 mL Mouth Rinse QID  . chlorhexidine  15 mL Mouth/Throat BID  . Chlorhexidine Gluconate Cloth  6 each Topical Q0600  . dexamethasone  4 mg Per Tube Daily  . fluconazole (DIFLUCAN) IV  200 mg Intravenous Once  . free water  250 mL Per Tube Q6H  . heparin subcutaneous  5,000 Units Subcutaneous 3 times per day  . imipenem-cilastatin  250 mg Intravenous Q12H  . insulin aspart  0-9 Units Subcutaneous 6 times per day  . insulin detemir  10 Units Subcutaneous Daily  . ipratropium-albuterol  3 mL Nebulization Q6H  . multivitamin with minerals  1 tablet Per Tube Daily  . mupirocin ointment  1 application Nasal BID  . pantoprazole (PROTONIX) IV  40 mg Intravenous Q12H  . senna  1 tablet Per Tube Daily  . sodium chloride  10-40 mL Intracatheter Q12H  . sodium chloride  3 mL Intravenous Q12H  . vitamin C  500 mg Per Tube Daily    Objective: Vital signs in last 24 hours: Temp:  [96.8 F (36 C)-98 F (36.7 C)] 97.7 F (36.5 C) (10/22 0724) Pulse Rate:  [79-102] 102 (10/22 0907) Resp:  [18-31] 31 (10/22 0907) BP: (82-137)/(33-97) 127/97 mmHg (10/22  0907) SpO2:  [91 %-100 %] 95 % (10/22 0907) FiO2 (%):  [30 %] 30 % (10/22 0907)  Physical Exam-  Head: Normocephalic, without obvious abnormality, atraumatic Resp: On Vent, Trach, equal vols of air. Cardio: regular GI: soft, non-tender; bowel sounds normal; no masses,  no organomegaly Extremities: puffy lower extremities without pitting, moves all extremities with commands.  Neurologic: Could respond by noding his head that he is in the hospital.   Lab Results  Recent Labs  05/08/14 0258  05/08/14 1600 05/09/14 0400 05/09/14 0500  WBC 10.0  < > 10.2  --  11.7*  HGB 7.9*  < > 9.1*  --  8.5*  HCT 24.0*  < > 28.7*  --  25.9*  NA 140  --   --  144  --   K 4.3  --   --  4.1  --   CL 102  --   --  109  --   CO2 22  --   --  22  --   BUN 123*  --   --  103*  --   CREATININE 3.84*  --   --  3.09*  --   < > = values in this interval not displayed. Liver Panel  Recent Labs  05/07/14 0213  PROT 6.1  ALBUMIN 1.7*  AST 19  ALT 29  ALKPHOS 108  BILITOT 0.3   Microbiology: Urine Cultures- collected 10/19, NGTD, 10/22 >> Blood cultures X2 -collected 10/19, NGTD, 10/22 >>  Studies/Results: Dg Chest Port 1 View  05/08/2014   CLINICAL DATA:  PICC line adjustment.  EXAM: PORTABLE CHEST - 1 VIEW  COMPARISON:  05/08/2014 and 05/06/2014.  FINDINGS: 2213 hr. Left arm PICC extends to the level of the lower SVC. There is no suggestion of a curved distal appearance on this repeat AP view. The previously noted right arm PICC overlapping the right axilla is no longer visualized. Tracheostomy appears unchanged. There is stable perihilar opacity within the right lung. The heart size and mediastinal contours are stable.  IMPRESSION: Left arm PICC extends to the level of the lower SVC and demonstrates a normal configuration on this single AP view.   Electronically Signed   By: Camie Patience M.D.   On: 05/08/2014 22:27   Dg Chest Port 1 View  05/08/2014   CLINICAL DATA:  80 year old male with  central line placement. Initial encounter.  EXAM: PORTABLE CHEST - 1 VIEW  COMPARISON:  05/06/2014.  FINDINGS: Portable AP view at 2100 hrs.  There is what appears to be a persistent right upper extremity PICC line catheter terminating at the axilla.  Stable tracheostomy.  New left upper extremity approach PICC line catheter, terminates just above the carina, but has a curved distal appearance, such that it may be terminating in the azygos arch. A lateral view would confirm.  Stable cardiac size and mediastinal contours. Interval increased right perihilar opacity. No pneumothorax and stable ventilation elsewhere.  IMPRESSION: 1. Left upper extremity PICC line placed, terminating in the SVC but may be extending into the azygos vein. A lateral view (could be a portable cross-table lateral if necessary) would confirm. 2. New right perihilar patchy opacity could reflect atelectasis, aspiration, or pneumonia. 3. Stable right upper extremity approach catheter terminating in the axilla.   Electronically Signed   By: Lars Pinks M.D.   On: 05/08/2014 21:23   Antibiotics-  On ertapenem prior to admission Ceftriazone- 05/07/2014 . impenem- 10/20/ >>   Assessment/Plan: Sepsis 2/2 UTI - On imipenem. For 2 more days. Hypothermia with increasing leukocytosis. Palliative met with family pt, daughters still want pt to be full code and cont aggressive management.  - Reculture if febrile or hypothermic. - If decompensates, will start Daptomycin 35m/kg Q48H- renal dosing, check baseline CK first. - Check differential, CBC in the am - urine culture has identified yeast- which likely related to his chronic indwelling foley cath.   Decubitus Ulcers- per primary.   AMS likely 2/2 uremia not sepsis, recommend Continuing imipenem 2 more days then stop.  AKI on CKD- Sepsis likely contributing.  EBing Neighbors MD PGY-2 IMTS [phone]. 05/09/2014, 10:26 AM  ------------------------ I saw and evaluated the patient. I  personally confirmed the key portions of the history and exam documented by Dr. EDamaris HippoI reviewed pertinent patient results. The assessment and plans were formulated together and I agree with the documentation in the resident's note.  CElzie RingsSHillcrest Heightsfor Infectious Diseases [phone]

## 2014-05-09 NOTE — Care Management Note (Signed)
    Page 1 of 1   05/09/2014     3:05:31 PM CARE MANAGEMENT NOTE 05/09/2014  Patient:  Shawn Lamb, Shawn Lamb   Account Number:  [id number]  Date Initiated:  05/08/2014  Documentation initiated by:  Nakeda Lebron  Subjective/Objective Assessment:   dx sepsis; transferred from [facility]     In-house referral  Clinical Social Worker      DC Planning Services  CM consult      Status of service:  In process, will continue to follow Medicare Important Message given?  YES (If response is "NO", the following Medicare IM given date fields will be blank) Date Medicare IM given:  05/09/2014 Medicare IM given by:  Murl Zogg Date Additional Medicare IM given:   Additional Medicare IM given by:    Per UR Regulation:  Reviewed for med. necessity/level of care/duration of stay  Comments:  05/09/14 [place] MSN BSN CCM TC to dtr Stanton Kidney [phone]) who states the family wants pt to transfer to [facility] and have talked with admissions coordinator there - CSW will fax requested clinicals.  Daneil Dan that family would be required to pay for the cost of transportation - Stanton Kidney said family is prepared to pay.  Discussed with NP who anticipates pt will be medically stable in 3-4 days.  05/08/14 [place] MSN BSN CCM Met with two dtrs @ bedside.  They are from [place] and state they slept in their car last night, inquiring about any free housing available.  TC to chaplain's office - one room available @ [facility].  Provided information to dtrs and they plan to go to chaplain's office to arrange accommodations. 1515 Per PC attending, dtrs want pt to move to facility near [place].  Dtrs are @ chaplain's office - will alert CSW.

## 2014-05-09 NOTE — Progress Notes (Signed)
PT Cancellation Note  Patient Details Name: Shawn Lamb MRN: [id number] DOB: 1923-07-11   Cancelled Treatment:    Reason Eval/Treat Not Completed: Patient not medically ready.  Pt continues to have a strict bedrest order.  Please advance activity order when appropriate for mobility.     [name], [name] 05/09/2014, 7:52 AM

## 2014-05-09 NOTE — Progress Notes (Signed)
OT Cancellation Note  Patient Details Name: Shawn Lamb MRN: [id number] DOB: 1922/10/02   Cancelled Treatment:    Reason Eval/Treat Not Completed: Patient not medically ready. Awaiting update of bedrest order, currently on strict bedrest.  [name], Mikyla Schachter Lynn 05/09/2014, 8:16 AM

## 2014-05-10 LAB — BASIC METABOLIC PANEL
Anion gap: 11 (ref 5–15)
BUN: 97 mg/dL — ABNORMAL HIGH (ref 6–23)
CHLORIDE: 108 meq/L (ref 96–112)
CO2: 24 meq/L (ref 19–32)
CREATININE: 3.05 mg/dL — AB (ref 0.50–1.35)
Calcium: 9.1 mg/dL (ref 8.4–10.5)
GFR calc Af Amer: 19 mL/min — ABNORMAL LOW (ref >90)
GFR calc non Af Amer: 17 mL/min — ABNORMAL LOW (ref >90)
GLUCOSE: 194 mg/dL — AB (ref 70–99)
POTASSIUM: 4.5 meq/L (ref 3.7–5.3)
Sodium: 143 mEq/L (ref 137–147)

## 2014-05-10 LAB — GLUCOSE, CAPILLARY
Glucose-Capillary: 236 mg/dL — ABNORMAL HIGH (ref 70–99)
Glucose-Capillary: 194 mg/dL — ABNORMAL HIGH (ref 70–99)
Glucose-Capillary: 156 mg/dL — ABNORMAL HIGH (ref 70–99)
Glucose-Capillary: 141 mg/dL — ABNORMAL HIGH (ref 70–99)
Glucose-Capillary: 202 mg/dL — ABNORMAL HIGH (ref 70–99)
Glucose-Capillary: 239 mg/dL — ABNORMAL HIGH (ref 70–99)

## 2014-05-10 LAB — CBC
HCT: 25.9 % — ABNORMAL LOW (ref 39.0–52.0)
HEMOGLOBIN: 8.2 g/dL — AB (ref 13.0–17.0)
MCH: 26.3 pg (ref 26.0–34.0)
MCHC: 31.7 g/dL (ref 30.0–36.0)
MCV: 83 fL (ref 78.0–100.0)
Platelets: 219 10*3/uL (ref 150–400)
RBC: 3.12 MIL/uL — ABNORMAL LOW (ref 4.22–5.81)
RDW: 15.8 % — AB (ref 11.5–15.5)
WBC: 14.7 10*3/uL — AB (ref 4.0–10.5)

## 2014-05-10 MED ORDER — HEPARIN SODIUM (PORCINE) 5000 UNIT/ML IJ SOLN
5000.0000 [IU] | Freq: Three times a day (TID) | INTRAMUSCULAR | Status: DC
  Administered 2014-05-10 – 2014-05-15 (×17): 5000 [IU] via SUBCUTANEOUS
  Filled 2014-05-10 (×17): qty 1

## 2014-05-10 MED ORDER — METOPROLOL TARTRATE 1 MG/ML IV SOLN
5.0000 mg | INTRAVENOUS | Status: DC
  Administered 2014-05-10 – 2014-05-12 (×8): 5 mg via INTRAVENOUS
  Filled 2014-05-10 (×15): qty 5

## 2014-05-10 NOTE — Progress Notes (Addendum)
[facility]    Date of Admission:  05/06/2014   Total days of antibiotics 5        Day 4 Imipenem.           ID: Shawn Lamb is a 80 y.o. male with  PMH of COPD, Trach- vent dependent, CHF, DM. [facility] where he was treated with etapenem for UTI- VRE. Ceftraizone started on admission here, and d/c due to rash. Presently on imipenem.   Principal Problem:   Sepsis secondary to UTI Active Problems:   UTI (lower urinary tract infection)   COPD (chronic obstructive pulmonary disease)   Anemia   Tracheostomy dependent   Diabetes mellitus without complication   CHF (congestive heart failure)   Acute encephalopathy  Subjective: Same as yesterday. Drowsy, some responds to commands and eye opening to voice.    Medications:  . antiseptic oral rinse  7 mL Mouth Rinse QID  . chlorhexidine  15 mL Mouth/Throat BID  . Chlorhexidine Gluconate Cloth  6 each Topical Q0600  . dexamethasone  4 mg Per Tube Daily  . free water  250 mL Per Tube Q6H  . heparin subcutaneous  5,000 Units Subcutaneous 3 times per day  . insulin aspart  0-9 Units Subcutaneous 6 times per day  . insulin detemir  10 Units Subcutaneous Daily  . ipratropium-albuterol  3 mL Nebulization Q6H  . metoprolol  5 mg Intravenous Q4H  . multivitamin with minerals  1 tablet Per Tube Daily  . mupirocin ointment  1 application Nasal BID  . pantoprazole (PROTONIX) IV  40 mg Intravenous Q12H  . senna  1 tablet Per Tube Daily  . sodium chloride  10-40 mL Intracatheter Q12H  . sodium chloride  3 mL Intravenous Q12H  . vitamin C  500 mg Per Tube Daily    Objective: Vital signs in last 24 hours: Temp:  [98 F (36.7 C)-99 F (37.2 C)] 99 F (37.2 C) (10/23 0805) Pulse Rate:  [91-112] 104 (10/23 0925) Resp:  [17-30] 23 (10/23 0925) BP: (85-140)/(35-112) 88/65 mmHg (10/23 0925) SpO2:  [94 %-100 %] 98 % (10/23 0925) FiO2 (%):  [30 %] 30 % (10/23 0925)  Physical Exam-   Head: Normocephalic, without obvious abnormality, atraumatic Resp: On Vent, Trach, equal vols of air, PEEP- 5, O2 - 30%. Cardio: regular, no added sounds identified GI: soft, non-tender; bowel sounds normal; no masses, no organomegaly Extremities: puffy lower extremities without pitting, moves most extremities with commands.  Neurologic: follows some commands.   Lab Results  Recent Labs  05/09/14 0400 05/09/14 0500 05/10/14 0650  WBC  --  11.7* 14.7*  HGB  --  8.5* 8.2*  HCT  --  25.9* 25.9*  NA 144  --  143  K 4.1  --  4.5  CL 109  --  108  CO2 22  --  24  BUN 103*  --  97*  CREATININE 3.09*  --  3.05*   Microbiology: Urine Cultures- collected 10/19, >100 000 colonies of yeast. Final. Blood cultures X2 -collected 10/19, NGTD, 10/22 >> Respiratory Cultures- collected 10/22, No pathogenic oropharyngeal-type flora seen, rare GNR.  Studies/Results: Dg Chest Port 1 View  05/08/2014   CLINICAL DATA:  PICC line adjustment.  EXAM: PORTABLE CHEST - 1 VIEW  COMPARISON:  05/08/2014 and 05/06/2014.  FINDINGS: 2213 hr. Left arm PICC extends to the level of the lower SVC. There is no suggestion of a curved distal appearance on  this repeat AP view. The previously noted right arm PICC overlapping the right axilla is no longer visualized. Tracheostomy appears unchanged. There is stable perihilar opacity within the right lung. The heart size and mediastinal contours are stable.  IMPRESSION: Left arm PICC extends to the level of the lower SVC and demonstrates a normal configuration on this single AP view.   Electronically Signed   By: Roxy HorsemanBill  Veazey M.D.   On: 05/08/2014 22:27   Dg Chest Port 1 View  05/08/2014   CLINICAL DATA:  80 year old male with central line placement. Initial encounter.  EXAM: PORTABLE CHEST - 1 VIEW  COMPARISON:  05/06/2014.  FINDINGS: Portable AP view at 2100 hrs.  There is what appears to be a persistent right upper extremity PICC line catheter terminating at the axilla.   Stable tracheostomy.  New left upper extremity approach PICC line catheter, terminates just above the carina, but has a curved distal appearance, such that it may be terminating in the azygos arch. A lateral view would confirm.  Stable cardiac size and mediastinal contours. Interval increased right perihilar opacity. No pneumothorax and stable ventilation elsewhere.  IMPRESSION: 1. Left upper extremity PICC line placed, terminating in the SVC but may be extending into the azygos vein. A lateral view (could be a portable cross-table lateral if necessary) would confirm. 2. New right perihilar patchy opacity could reflect atelectasis, aspiration, or pneumonia. 3. Stable right upper extremity approach catheter terminating in the axilla.   Electronically Signed   By: Augusto GambleLee  Hall M.D.   On: 05/08/2014 21:23   Antibiotics-  On ertapenem prior to admission Ceftriazone- 05/07/2014 . impenem- 10/20/ >>   Assessment/Plan: Sepsis 2/2 UTI - On imipenem. ,mildly increased leukocytosis- 14.7 today but no concurrent fevers. - Reculture if febrile or hypothermic. - if decompensates Consider Daptomycin 8mg /kg Q48H- renal dosing, check baseline CK first.  - Check differential, CBC in the am - urine culture has identified yeast- which likely related to his chronic indwelling foley cath.    Decubitus Ulcers- per primary.   AMS improved back to baseline  AKI on CKD- Sepsis likely contributing. Improved initially but Appears to have plateaued at 3.05- 10/23  Inocente SallesEjiro Emokpae, MD PGY-2 IMTS [phone]. 05/10/2014, 11:58 AM   ------------------------ I personally evaluated patient on 05/10/2014, agree with above findings. Assessment and plan are detailed by dr. Mariea ClontsEmokpae.  Dr. Ninetta LightsHatcher available for questions over the weekend. We will see the patient back on Monday if he is still here  Aram Beechamynthia B. Drue SecondSnider MD MPH [facility] [phone]

## 2014-05-10 NOTE — Clinical Social Work Note (Addendum)
12:30 CSW spoke with patients daughter, Corrie DandyMary, and relayed information below concerning patients insurance.  Patients family is adamant about getting patient to [place] because that is what the patient wanted so that he could be closer to a large portion of his family.  CSW provided active listening and informed patients family that there is a facility in [facility] but that [place] facility [place].  CSW informed family that there are very limited facilities that take ventilators.   Family stated that they will contact [facility] to discuss emergency Medicaid as a way to come to [place] and will call CSW back to inform about what they want to do next.  11:20 CSW spoke with Stanton Kidneyebra at [facility] in [place].  Stanton KidneyDebra reported that patient does not have any more LTACH Medicare days left included life time days.  Stanton KidneyDebra also stated that patient would need to stay in [place] for 30 days before becoming eligible for [facility].  CSW will continue to follow.  Merlyn LotJenna Holoman, LCSWA Clinical Social Worker [phone]

## 2014-05-10 NOTE — Evaluation (Signed)
Physical Therapy Evaluation Patient Details Name: Shawn Lamb MRN: [id number] DOB: 10/26/1922 Today's Date: 05/10/2014   History of Present Illness  pt presents from [facility] with UTI,HCAP, and Sepsis.  pt has been at [facility] since 03/14/14 working on weaning from vent.    Clinical Impression  Pt requires total A for all mobility and did not participate in any aspect of mobility.  Pt's Systolic BP decreased 588 to 89 from supine to sitting EOB.  Pt unable to tolerate sitting position, so returned to supine.  Pt tended to lean to R side, so re-positioned with pillows to place pt in more midline position.  No further acute PT needs at this time, will defer further therapies to Ltach vs Vent SNF.  Will sign off.      Follow Up Recommendations LTACH    Equipment Recommendations  None recommended by PT    Recommendations for Other Services       Precautions / Restrictions Precautions Precautions: Fall Precaution Comments: Vent, trach, Peg tube. Restrictions Weight Bearing Restrictions: No      Mobility  Bed Mobility Overal bed mobility: Needs Assistance;+2 for physical assistance Bed Mobility: Supine to Sit;Sit to Supine     Supine to sit: Total assist;+2 for physical assistance;HOB elevated Sit to supine: Total assist;+2 for physical assistance;HOB elevated   General bed mobility comments: pt without participation in mobility.    Transfers                    Ambulation/Gait                Stairs            Wheelchair Mobility    Modified Rankin (Stroke Patients Only)       Balance Overall balance assessment: Needs assistance Sitting-balance support: Bilateral upper extremity supported;Feet supported Sitting balance-Leahy Scale: Zero Sitting balance - Comments: pt requires total A to maintain sitting balance.                                       Pertinent Vitals/Pain Pain Assessment: Faces Faces Pain Scale:  Hurts a little bit Pain Location: Unknown Pain Intervention(s): Repositioned;Monitored during session    [place] expects to be discharged to::  (Ltach)                 Additional Comments: pt from [facility] and noted plan is trying for vent facility in [place], [place] vs return to [facility].      Prior Function           Comments: pt unable to state PLOF, however per chart pt seems to have needed total care.      Hand Dominance        Extremity/Trunk Assessment   Upper Extremity Assessment: Defer to OT evaluation           Lower Extremity Assessment: Generalized weakness;RLE deficits/detail;LLE deficits/detail RLE Deficits / Details: pt with increased flexion tone and exhibits flexor withdrawl to tactile stimuli to foot.   LLE Deficits / Details: pt with increased flexor tone and exhibits flexor withdrawl to tactile stimuli to foot.       Communication   Communication: Tracheostomy  Cognition Arousal/Alertness: Awake/alert Behavior During Therapy: Flat affect Overall Cognitive Status: Difficult to assess  General Comments      Exercises        Assessment/Plan    PT Assessment All further PT needs can be met in the next venue of care  PT Diagnosis Generalized weakness   PT Problem List    PT Treatment Interventions     PT Goals (Current goals can be found in the Care Plan section) Acute Rehab PT Goals PT Goal Formulation: All assessment and education complete, DC therapy    Frequency     Barriers to discharge        Co-evaluation               End of Session Equipment Utilized During Treatment:  (Vent) Activity Tolerance: Treatment limited secondary to medical complications (Comment) Patient left: in bed;with call bell/phone within reach Nurse Communication: Mobility status (low BP)         Time: 8682-5749 PT Time Calculation (min): 29 min   Charges:   PT Evaluation $Initial PT  Evaluation Tier I: 1 Procedure     PT G CodesCatarina Lamb, Eleele 05/10/2014, 3:08 PM

## 2014-05-10 NOTE — Progress Notes (Signed)
Inpatient Diabetes Program Recommendations  AACE/ADA: New Consensus Statement on Inpatient Glycemic Control (2013)  Target Ranges:  Prepandial:   less than 140 mg/dL      Peak postprandial:   less than 180 mg/dL (1-2 hours)      Critically ill patients:  140 - 180 mg/dL   Results for Shawn Lamb, Shawn Lamb (MRN [id number]) as of 05/10/2014 08:01  Ref. Range 05/09/2014 05:12 05/09/2014 07:27 05/09/2014 12:54 05/09/2014 14:52 05/09/2014 15:03 05/09/2014 20:03 05/10/2014 01:04 05/10/2014 04:48  Glucose-Capillary Latest Range: 70-99 mg/dL 81 76 56 (L) 409111 (H) 811106 (H) 188 (H) 236 (H) 194 (H)   Diabetes history: DM2  Outpatient Diabetes medications: Levemir 30 units daily, Levemir 10 units QHS, Novolog 5-20 units Q6H  Current orders for Inpatient glycemic control: Levemir 10 units daily, Novolog 0-9 units Q4H   Inpatient Diabetes Program Recommendations Insulin - Basal: Please increase Levemir to 13 units daily.  Thanks, Shawn PennerMarie Zyan Mirkin, RN, MSN, CCRN Diabetes Coordinator Inpatient Diabetes Program [phone] (Team Pager) [phone] (AP office) [phone] [facility](MC office)

## 2014-05-10 NOTE — Progress Notes (Signed)
[name] YIR:[id number] DOB: Oct 26, 1922 DOA: 05/06/2014 PCP: Verneita Griffes, MD  Brief narrative: 80 year old male patient with known history of COPD, dementia and diabetes. Resident of [facility] facility secondary to chronic tracheostomy. He also has a chronic Foley catheter. He was sent to the emergency department because of altered mentation and apparent urinary retention. The nursing staff there had noticed decreased output and blood clots in the Foley catheter with associated altered mentation. This patient apparently has a history of recurrent UTI and was being treated for VRE UTI with ertapenem. According to the records from [place] he also has had progressive acute renal failure with severe azotemia.  Upon arrival to [facility] he was found to have a leukocytosis of 11,300. His creatinine had increased from a baseline of 1.6 to 4.8 and he was tachycardic. He also had azotemia with BUN of 117.  Since admission he has undergone IV fluid hydration with some improvement in his renal function although this has not returned to baseline. Infectious disease service has been consulted and suspects reason for altered mentation secondary to acute urinary retention and resultant renal failure but they do not suspect patient has acute UTI and his picture is more consistent with colonization. Patient's overall long-term prognosis is poor and palliative medicine met with the patient's daughters on 10/21. At that time family opted to continue with aggressive medical interventions.  After rehydration patient has developed productive yellow-green sputum so sputum cultures been obtained. Urinalysis positive for greater than 100,000 colonies of yeast so a one-time dose of Diflucan 200 mg IV given. Renal function slowly improving with aggressive hydration.  HPI/Subjective: Patient less alert today. Seems to be fighting the vent noting increased PA W.  pressures.  Assessment/Plan:    Sepsis secondary to UTI vs suspectedHCAP Treating acidosis related to UTI but based on infectious disease assessment current pattern more consistent with colonization-ID recommended continuing imipenem for an additional 3-4 days to cover drug-resistant Escherichia coli-current urine and blood cultures pending-continue supportive care-MRSA PCR positive-ID considering adding daptomycin  **10/21 Family met with PALLIATIVE TEAM  And request to continue ALL forms of aggressive care    Acute respiratory failure with hypoxia due to presumed HCAP Since admission and rehydration patient has developed productive sputum yellow and green in color -tracheal aspirate culture obtained on 10/22 and results are pending -portable chest x-ray on 10/21 with patchy areas hilar infiltrate on the right as well as right basilar haziness- currently on full ventilatory support but oxygen weaned to 30%-continue empiric Primaxin-I&D has added linezolid since bursa PCR positive   Hypotension Recurs intermittently despite fluid resuscitation-discontinued Flomax 10/21-BPs are variable and could be explained by need to check and lower extremity but as precaution we'll check cortisol level    Acute renal failure on ? chronic kidney disease  Baseline renal function based on [facility] documentation from [facility] shows best renal function BUN 80 with creatinine 1.6-peak BUN this admission 166 with peak creatinine 4.68-as of 10/21 current trend was down but had not returned to baseline and seemed more consistent with volume depletion so was given fluid challenges and IV fluid rate increased-renal function continues to slowly improve and has excellent urinary output-continue IV fluids but decrease rate to 50 cc per hour    Anemia Iron slightly low, ferritin mildly elevated and folate greater than 20. B12 728-follow after hydration    Diabetes mellitus without complication CBGs much better control noting  in the  previous 24 hours have been running in the 300s-hemoglobin A1c 7.4-continue Levemir and sliding scale insulin   Dysphagia tube feeding per PEG    History of unknown type CHF (no TTE available) Actually appears dry and not exacerbated    Acute encephalopathy Seems to be resolving noting patient nodding appropriately to questions asked   DVT prophylaxis: Subcutaneous heparin Code Status: Full Family Communication: No family at bedside Disposition Plan/Expected LOS: Stepdown-eventual return to the ventilator skilled nursing facility; family considering facility in [place]  Consultants: Palliative medicine Infectious disease  Procedures: PICC line right upper extremity (which was placed prior to admission) nonfunctional and was discontinued 10/21  PICC line left upper arm  10/21  Cultures: Blood cultures pending Urine culture > 100,000 colonies yeast Tracheal aspirate culture pending  Antibiotics: Ceftriaxone x1 dose Ertapenem x1 dose Imipenem 10/20 >>> Diflucan IV x1 dose  Objective: Blood pressure 88/65, pulse 97, temperature 99 F (37.2 C), temperature source Oral, resp. rate 24, height 6' (1.829 m), weight 219 lb (99.338 kg), SpO2 100.00%.  Intake/Output Summary (Last 24 hours) at 05/10/14 1235 Last data filed at 05/10/14 1005  Gross per 24 hour  Intake 3078.42 ml  Output    800 ml  Net 2278.42 ml   Exam: Gen: Mild respiratory distress as evidenced by somewhat increased work of breathing and elevated PA W. pressures Chest: Clear to auscultation except for somewhat diminished with decreased expiratory rhonchi on right, trach tube to ventilator Cardiac: Regular rate and rhythm, S1-S2, no rubs murmurs or gallops, no peripheral edema Abdomen: Soft nontender nondistended without obvious hepatosplenomegaly, no ascites-PEG tube with tube feeding infusing Extremities: Symmetrical in appearance without cyanosis, clubbing or effusion  Scheduled Meds:    Scheduled Meds: . antiseptic oral rinse  7 mL Mouth Rinse QID  . chlorhexidine  15 mL Mouth/Throat BID  . Chlorhexidine Gluconate Cloth  6 each Topical Q0600  . dexamethasone  4 mg Per Tube Daily  . free water  250 mL Per Tube Q6H  . heparin subcutaneous  5,000 Units Subcutaneous 3 times per day  . insulin aspart  0-9 Units Subcutaneous 6 times per day  . insulin detemir  10 Units Subcutaneous Daily  . ipratropium-albuterol  3 mL Nebulization Q6H  . metoprolol  5 mg Intravenous Q4H  . multivitamin with minerals  1 tablet Per Tube Daily  . mupirocin ointment  1 application Nasal BID  . pantoprazole (PROTONIX) IV  40 mg Intravenous Q12H  . senna  1 tablet Per Tube Daily  . sodium chloride  10-40 mL Intracatheter Q12H  . sodium chloride  3 mL Intravenous Q12H  . vitamin C  500 mg Per Tube Daily    Data Reviewed: Basic Metabolic Panel:  Recent Labs Lab 05/06/14 1936 05/07/14 0213 05/08/14 0258 05/09/14 0400 05/10/14 0650  NA 133* 133* 140 144 143  K 4.6 4.6 4.3 4.1 4.5  CL 93* 95* 102 109 108  CO2 $Re'24 23 22 22 24  'raT$ GLUCOSE 268* 333* 151* 72 194*  BUN 117* 126* 123* 103* 97*  CREATININE 4.68* 4.51* 3.84* 3.09* 3.05*  CALCIUM 9.3 8.8 9.1 9.1 9.1   Liver Function Tests:  Recent Labs Lab 05/07/14 0213  AST 19  ALT 29  ALKPHOS 108  BILITOT 0.3  PROT 6.1  ALBUMIN 1.7*   CBC:  Recent Labs Lab 05/06/14 1936  05/08/14 0258 05/08/14 0923 05/08/14 1600 05/09/14 0500 05/10/14 0650  WBC 11.3*  < > 10.0 10.3 10.2 11.7* 14.7*  NEUTROABS  10.2*  --   --   --   --  9.2*  --   HGB 8.1*  < > 7.9* 8.4* 9.1* 8.5* 8.2*  HCT 25.4*  < > 24.0* 25.9* 28.7* 25.9* 25.9*  MCV 84.4  < > 82.8 82.2 83.9 83.8 83.0  PLT 185  < > 172 228 73* 206 219  < > = values in this interval not displayed.  Cardiac Enzymes:  Recent Labs Lab 05/07/14 0213 05/07/14 0753  TROPONINI <0.30 <0.30   BNP (last 3 results)  Recent Labs  05/07/14 0213 05/07/14 1317  PROBNP 3433.0* 2701.0*    CBG:  Recent Labs Lab 05/09/14 1503 05/09/14 2003 05/10/14 0104 05/10/14 0448 05/10/14 0802  GLUCAP 106* 188* 236* 194* 156*    Recent Results (from the past 240 hour(s))  URINE CULTURE     Status: None   Collection Time    05/06/14  7:04 PM      Result Value Ref Range Status   Specimen Description URINE, CLEAN CATCH   Final   Special Requests NONE   Final   Culture  Setup Time     Final   Value: 05/06/2014 20:38     Performed at [facility] Count     Final   Value: >=100,000 COLONIES/ML     Performed at [facility]   Culture     Final   Value: YEAST     Performed at [facility]   Report Status 05/08/2014 FINAL   Final  CULTURE, BLOOD (ROUTINE X 2)     Status: None   Collection Time    05/06/14  8:52 PM      Result Value Ref Range Status   Specimen Description BLOOD ARM LEFT   Final   Special Requests BOTTLES DRAWN AEROBIC AND ANAEROBIC 10CC   Final   Culture  Setup Time     Final   Value: 05/07/2014 01:18     Performed at [facility]   Culture     Final   Value:        BLOOD CULTURE RECEIVED NO GROWTH TO DATE CULTURE WILL BE HELD FOR 5 DAYS BEFORE ISSUING A FINAL NEGATIVE REPORT     Performed at [facility]   Report Status PENDING   Incomplete  CULTURE, BLOOD (ROUTINE X 2)     Status: None   Collection Time    05/06/14  9:00 PM      Result Value Ref Range Status   Specimen Description BLOOD HAND LEFT   Final   Special Requests BOTTLES DRAWN AEROBIC ONLY 8CC   Final   Culture  Setup Time     Final   Value: 05/07/2014 01:18     Performed at [facility]   Culture     Final   Value:        BLOOD CULTURE RECEIVED NO GROWTH TO DATE CULTURE WILL BE HELD FOR 5 DAYS BEFORE ISSUING A FINAL NEGATIVE REPORT     Performed at [facility]   Report Status PENDING   Incomplete  MRSA PCR SCREENING     Status: Abnormal   Collection Time    05/07/14 12:35 PM      Result Value Ref Range Status   MRSA by  PCR POSITIVE (*) NEGATIVE Final   Comment:            The GeneXpert MRSA Assay (FDA     approved  for NASAL specimens     only), is one component of a     comprehensive MRSA colonization     surveillance program. It is not     intended to diagnose MRSA     infection nor to guide or     monitor treatment for     MRSA infections.     RESULT CALLED TO, READ BACK BY AND VERIFIED WITH:     WELLINGTON RN 14:05 05/07/14 ([email or web address])  CULTURE, RESPIRATORY (NON-EXPECTORATED)     Status: None   Collection Time    05/09/14  9:15 AM      Result Value Ref Range Status   Specimen Description TRACHEAL ASPIRATE   Final   Special Requests NONE   Final   Gram Stain     Final   Value: FEW WBC PRESENT, PREDOMINANTLY PMN     NO SQUAMOUS EPITHELIAL CELLS SEEN     RARE GRAM NEGATIVE RODS     Performed at [facility]   Culture     Final   Value: Non-Pathogenic Oropharyngeal-type Flora Isolated.     Performed at [facility]   Report Status PENDING   Incomplete     Studies:  Recent x-ray studies have been reviewed in detail by the Attending Physician  Time spent :  [place], ANP [facility] Office  [phone] Pager [phone]   **If unable to reach the above provider after paging please contact the [place] @ [phone]  On-Call/Text Page:      [email or web address]      password [email or web address]  If 7PM-7AM, please contact night-coverage [email or web address] Password [email or web address] 05/10/2014, 12:35 PM   LOS: 4 days    Addendum  I personally evaluated patient on 05/10/2014, agree with above findings. Patient is a pleasant 80 year old with a past medical history of chronic hypoxemic respiratory failure, status post tracheostomy placement, transferred from [place] to the emergency department on 05/06/2014 presenting with mental status changes. He has a history of urinary retention with chronic indwelling Foley catheter. Sepsis felt to be secondary to urinary tract infection.  Infectious disease consulted. He remains on Primaxin IV. Patient is arousable, following simple commands.

## 2014-05-10 NOTE — Evaluation (Signed)
Occupational Therapy Evaluation Patient Details Name: Alcus DadForrester Gronewold MRN: [id number] DOB: 1922-08-27 Today's Date: 05/10/2014    History of Present Illness pt presents from [facility] with UTI,HCAP, and Sepsis.  pt has been at [facility] since 03/14/14 working on weaning from vent.     Clinical Impression   Pt requires total assist for all mobility and ADL. With sitting EOB, pt became hypotensive requiring nearly immediate return to supine.  Positioned optimally in bed.  No further acute OT needs.  [facility]    Follow Up Recommendations  [facility]    Equipment Recommendations       Recommendations for Other Services       Precautions / Restrictions Precautions Precautions: Fall Precaution Comments: Vent, trach, Peg tube. Restrictions Weight Bearing Restrictions: No      Mobility Bed Mobility Overal bed mobility: Needs Assistance;+2 for physical assistance Bed Mobility: Supine to Sit;Sit to Supine     Supine to sit: Total assist;+2 for physical assistance;HOB elevated Sit to supine: Total assist;+2 for physical assistance;HOB elevated   General bed mobility comments: pt without participation in mobility.    Transfers                      Balance Overall balance assessment: Needs assistance Sitting-balance support: Bilateral upper extremity supported;Feet supported Sitting balance-Leahy Scale: Zero Sitting balance - Comments: pt requires total A to maintain sitting balance.                                      ADL                                         General ADL Comments: Total assist     Vision                     Perception     Praxis      Pertinent Vitals/Pain Pain Assessment: Faces Faces Pain Scale: Hurts a little bit Pain Location: unclear Pain Descriptors / Indicators: Grimacing Pain Intervention(s): Monitored during session;Repositioned     Hand Dominance Right    Extremity/Trunk Assessment Upper Extremity Assessment Upper Extremity Assessment: Generalized weakness (shoulder FF to 90 degrees, full elbow to hand)   Lower Extremity Assessment Lower Extremity Assessment: Defer to PT evaluation RLE Deficits / Details: pt with increased flexion tone and exhibits flexor withdrawl to tactile stimuli to foot.   LLE Deficits / Details: pt with increased flexor tone and exhibits flexor withdrawl to tactile stimuli to foot.         Communication Communication Communication: Tracheostomy   Cognition Arousal/Alertness: Awake/alert Behavior During Therapy: Flat affect Overall Cognitive Status: Difficult to assess                     General Comments       Exercises       Shoulder Instructions      Home Living Family/patient expects to be discharged to::  [facility])                                 Additional Comments: Plan is for return to [facility].      Prior Functioning/Environment Level of Independence: Needs assistance  Comments: pt unable to state PLOF, however per chart pt seems to have needed total care.     OT Diagnosis:     OT Problem List:     OT Treatment/Interventions:      OT Goals(Current goals can be found in the care plan section)    OT Frequency:     Barriers to D/C:            Co-evaluation PT/OT/SLP Co-Evaluation/Treatment: Yes Reason for Co-Treatment: Complexity of the patient's impairments (multi-system involvement);For patient/therapist safety   OT goals addressed during session: Strengthening/ROM      End of Session Nurse Communication: Other (comment) (hypotension with sitting)  Activity Tolerance: Treatment limited secondary to medical complications (Comment) (hypotensive) Patient left: in bed;with call bell/phone within reach   Time: 1417-1445 OT Time Calculation (min): 28 min Charges:  OT General Charges $OT Visit: 1 Procedure OT Evaluation $Initial OT Evaluation  Tier I: 1 Procedure OT Treatments $Therapeutic Activity: 8-22 mins G-Codes:    [name], Zakyria Metzinger Lynn 05/10/2014, 3:37 PM [phone]

## 2014-05-11 LAB — BASIC METABOLIC PANEL
Anion gap: 13 (ref 5–15)
BUN: 97 mg/dL — AB (ref 6–23)
CHLORIDE: 108 meq/L (ref 96–112)
CO2: 22 meq/L (ref 19–32)
CREATININE: 3.15 mg/dL — AB (ref 0.50–1.35)
Calcium: 8.9 mg/dL (ref 8.4–10.5)
GFR calc Af Amer: 19 mL/min — ABNORMAL LOW (ref >90)
GFR calc non Af Amer: 16 mL/min — ABNORMAL LOW (ref >90)
GLUCOSE: 280 mg/dL — AB (ref 70–99)
Potassium: 4.3 mEq/L (ref 3.7–5.3)
Sodium: 143 mEq/L (ref 137–147)

## 2014-05-11 LAB — TYPE AND SCREEN
ABO/RH(D): B POS
Antibody Screen: NEGATIVE
UNIT DIVISION: 0
Unit division: 0
Unit division: 0

## 2014-05-11 LAB — CBC
HCT: 24.9 % — ABNORMAL LOW (ref 39.0–52.0)
HEMOGLOBIN: 7.8 g/dL — AB (ref 13.0–17.0)
MCH: 26.5 pg (ref 26.0–34.0)
MCHC: 31.3 g/dL (ref 30.0–36.0)
MCV: 84.7 fL (ref 78.0–100.0)
Platelets: 273 10*3/uL (ref 150–400)
RBC: 2.94 MIL/uL — AB (ref 4.22–5.81)
RDW: 16.4 % — ABNORMAL HIGH (ref 11.5–15.5)
WBC: 19.1 10*3/uL — AB (ref 4.0–10.5)

## 2014-05-11 LAB — GLUCOSE, CAPILLARY
GLUCOSE-CAPILLARY: 207 mg/dL — AB (ref 70–99)
GLUCOSE-CAPILLARY: 251 mg/dL — AB (ref 70–99)
Glucose-Capillary: 181 mg/dL — ABNORMAL HIGH (ref 70–99)
Glucose-Capillary: 263 mg/dL — ABNORMAL HIGH (ref 70–99)
Glucose-Capillary: 270 mg/dL — ABNORMAL HIGH (ref 70–99)
Glucose-Capillary: 257 mg/dL — ABNORMAL HIGH (ref 70–99)

## 2014-05-11 LAB — CORTISOL: Cortisol, Plasma: 7 ug/dL

## 2014-05-11 MED ORDER — SODIUM CHLORIDE 0.9 % IV SOLN
250.0000 mg | Freq: Two times a day (BID) | INTRAVENOUS | Status: DC
  Administered 2014-05-11 – 2014-05-12 (×3): 250 mg via INTRAVENOUS
  Filled 2014-05-11 (×4): qty 250

## 2014-05-11 MED ORDER — DIPHENHYDRAMINE-ZINC ACETATE 2-0.1 % EX CREA
TOPICAL_CREAM | Freq: Three times a day (TID) | CUTANEOUS | Status: DC | PRN
  Filled 2014-05-11: qty 28

## 2014-05-11 MED ORDER — SODIUM CHLORIDE 0.9 % IV BOLUS (SEPSIS): 500.0000 mL | Freq: Once | INTRAVENOUS | Status: DC

## 2014-05-11 NOTE — Progress Notes (Signed)
[facility] - Stepdown / ICU Progress Note  [facility] IDP:[id number] DOB: 03/16/1923 DOA: 05/06/2014 PCP: Verneita Griffes, MD  Brief narrative: 80 year old male patient with known history of COPD, dementia and diabetes. Resident of [facility] facility secondary to chronic tracheostomy. He also has a chronic Foley catheter. He was sent to the emergency department because of altered mentation and apparent urinary retention. The nursing staff there had noticed decreased output and blood clots in the Foley catheter with associated altered mentation. This patient apparently has a history of recurrent UTI and was being treated for VRE UTI with ertapenem. According to the records from [facility] he also has had progressive acute renal failure with severe azotemia.  Upon arrival to [facility] he was found to have a leukocytosis of 11,300. His creatinine had increased from a baseline of Lamb.6 to 4.8 and he was tachycardic. He also had azotemia with BUN of 117.  Since admission he has undergone IV fluid hydration with some improvement in his renal function although this has not returned to baseline. Infectious disease service has been consulted and suspects reason for altered mentation secondary to acute urinary retention and resultant renal failure but they do not suspect patient has acute UTI and his picture is more consistent with colonization. Patient's overall long-term prognosis is poor and [facility] met with the patient's daughters on 10/21. At that time family opted to continue with aggressive medical interventions.  After rehydration patient has developed productive yellow-green sputum so sputum cultures been obtained. Urinalysis positive for greater than 100,000 colonies of yeast so a one-time dose of Diflucan 200 mg IV given. Renal function slowly improving with aggressive hydration.  HPI/Subjective: Patient is awake and alert, can't follow commands.  Assessment/Plan:    Sepsis  secondary to UTI vs suspectedHCAP Treating acidosis related to UTI but based on infectious disease assessment current pattern more consistent with colonization-ID recommended continuing imipenem for an additional 3-4 days to cover drug-resistant Escherichia coli-current urine and blood cultures pending-continue supportive care-MRSA PCR positive-ID considering adding daptomycin On 05/11/2014 lab work showing an upward trend in his white count to 19,100, remains afebrile On imipenem Infectious disease following Blood cultures obtained on 05/06/2014 showing no growth to date   **10/21 Family met with [facility]  And request to continue ALL forms of aggressive care    Acute respiratory failure with hypoxia due to presumed HCAP Since admission and rehydration patient has developed productive sputum yellow and green in color -tracheal aspirate culture obtained on 10/22 and results are pending -portable chest x-ray on 10/21 with patchy areas hilar infiltrate on the right as well as right basilar haziness- currently on full ventilatory support but oxygen weaned to 30%-continue empiric Primaxin-I&D has added linezolid since bursa PCR positive   Hypotension Recurs intermittently despite fluid resuscitation-discontinued Flomax 10/21-BPs are variable and could be explained by need to check and lower extremity but as precaution we'll check cortisol level    Acute renal failure on ? chronic kidney disease  Baseline renal function based on limited documentation from [facility] shows best renal function BUN 80 with creatinine Lamb.6-peak BUN this admission 166 with peak creatinine 4.68-as of 10/21 current trend was down but had not returned to baseline and seemed more consistent with volume depletion so was given fluid challenges and IV fluid rate increased-renal function continues to slowly improve and has excellent urinary output-continue IV fluids but decrease rate to 50 cc per hour    Anemia Iron slightly  low, ferritin  mildly elevated and folate greater than 20. B12 728-follow after hydration    Diabetes mellitus without complication CBGs much better control noting in the previous 24 hours have been running in the 300s-hemoglobin A1c 7.4-continue Levemir and sliding scale insulin   Dysphagia tube feeding per PEG    History of unknown type CHF (no TTE available) Actually appears dry and not exacerbated    Acute encephalopathy Seems to be resolving noting patient nodding appropriately to questions asked   DVT prophylaxis: Subcutaneous heparin Code Status: Full Family Communication: No family at bedside Disposition Plan/Expected LOS: Stepdown-eventual return to the ventilator skilled nursing facility; family considering facility in [place]  Consultants: [facility] Infectious disease  Procedures: PICC line right upper extremity (which was placed prior to admission) nonfunctional and was discontinued 10/21  PICC line left upper arm  10/21  Cultures: Blood cultures pending Urine culture > 100,000 colonies yeast Tracheal aspirate culture pending  Antibiotics: Ceftriaxone x1 dose Ertapenem x1 dose Imipenem 10/20 >>> Diflucan IV x1 dose  Objective: Blood pressure 119/50, pulse 85, temperature 98.7 F (37.Lamb C), temperature source Oral, resp. rate 19, height 6' (Lamb.829 m), weight 99.338 kg (219 lb), SpO2 100.00%.  Intake/Output Summary (Last 24 hours) at 05/11/14 1653 Last data filed at 05/11/14 1528  Gross per 24 hour  Intake   1050 ml  Output   1100 ml  Net    -50 ml   Exam: Gen: Patient is in no acute distress, he is awake alert and can follow commands Chest: Clear to auscultation except for somewhat diminished with decreased expiratory rhonchi on right, trach tube to ventilator Cardiac: Regular rate and rhythm, S1-S2, no rubs murmurs or gallops, no peripheral edema Abdomen: Soft nontender nondistended without obvious hepatosplenomegaly, no ascites-PEG  tube with tube feeding infusing Extremities: Symmetrical in appearance without cyanosis, clubbing or effusion  Scheduled Meds:  Scheduled Meds: . antiseptic oral rinse  7 mL Mouth Rinse QID  . chlorhexidine  15 mL Mouth/Throat BID  . Chlorhexidine Gluconate Cloth  6 each Topical Q0600  . dexamethasone  4 mg Per Tube Daily  . free water  250 mL Per Tube Q6H  . heparin subcutaneous  5,000 Units Subcutaneous 3 times per day  . insulin aspart  0-9 Units Subcutaneous 6 times per day  . insulin detemir  10 Units Subcutaneous Daily  . ipratropium-albuterol  3 mL Nebulization Q6H  . metoprolol  5 mg Intravenous Q4H  . multivitamin with minerals  Lamb tablet Per Tube Daily  . mupirocin ointment  Lamb application Nasal BID  . pantoprazole (PROTONIX) IV  40 mg Intravenous Q12H  . senna  Lamb tablet Per Tube Daily  . sodium chloride  10-40 mL Intracatheter Q12H  . sodium chloride  3 mL Intravenous Q12H  . vitamin C  500 mg Per Tube Daily    Data Reviewed: Basic Metabolic Panel:  Recent Labs Lab 05/07/14 0213 05/08/14 0258 05/09/14 0400 05/10/14 0650 05/11/14 1100  NA 133* 140 144 143 143  K 4.6 4.3 4.Lamb 4.5 4.3  CL 95* 102 109 108 108  CO2 $Re'23 22 22 24 22  'zYr$ GLUCOSE 333* 151* 72 194* 280*  BUN 126* 123* 103* 97* 97*  CREATININE 4.51* 3.84* 3.09* 3.05* 3.15*  CALCIUM 8.8 9.Lamb 9.Lamb 9.Lamb 8.9   Liver Function Tests:  Recent Labs Lab 05/07/14 0213  AST 19  ALT 29  ALKPHOS 108  BILITOT 0.3  PROT 6.Lamb  ALBUMIN Lamb.7*   CBC:  Recent Labs Lab  05/06/14 1936  05/08/14 0923 05/08/14 1600 05/09/14 0500 05/10/14 0650 05/11/14 1100  WBC 11.3*  < > 10.3 10.2 11.7* 14.7* 19.Lamb*  NEUTROABS 10.2*  --   --   --  9.2*  --   --   HGB 8.Lamb*  < > 8.4* 9.Lamb* 8.5* 8.2* 7.8*  HCT 25.4*  < > 25.9* 28.7* 25.9* 25.9* 24.9*  MCV 84.4  < > 82.2 83.9 83.8 83.0 84.7  PLT 185  < > 228 73* 206 219 273  < > = values in this interval not displayed.  Cardiac Enzymes:  Recent Labs Lab 05/07/14 0213 05/07/14 0753   TROPONINI <0.30 <0.30   BNP (last 3 results)  Recent Labs  05/07/14 0213 05/07/14 1317  PROBNP 3433.0* 2701.0*   CBG:  Recent Labs Lab 05/10/14 2347 05/11/14 0407 05/11/14 0740 05/11/14 1223 05/11/14 1525  GLUCAP 251* 207* 181* 263* 270*    Recent Results (from the past 240 hour(s))  URINE CULTURE     Status: None   Collection Time    05/06/14  7:04 PM      Result Value Ref Range Status   Specimen Description URINE, CLEAN CATCH   Final   Special Requests NONE   Final   Culture  Setup Time     Final   Value: 05/06/2014 20:38     Performed at [facility]     Final   Value: >=100,000 COLONIES/ML     Performed at [facility]   Culture     Final   Value: YEAST     Performed at [facility]   Report Status 05/08/2014 FINAL   Final  CULTURE, BLOOD (ROUTINE X 2)     Status: None   Collection Time    05/06/14  8:52 PM      Result Value Ref Range Status   Specimen Description BLOOD ARM LEFT   Final   Special Requests BOTTLES DRAWN AEROBIC AND ANAEROBIC 10CC   Final   Culture  Setup Time     Final   Value: 05/07/2014 01:18     Performed at [facility]   Culture     Final   Value:        BLOOD CULTURE RECEIVED NO GROWTH TO DATE CULTURE WILL BE HELD FOR 5 DAYS BEFORE ISSUING A FINAL NEGATIVE REPORT     Performed at [facility]   Report Status PENDING   Incomplete  CULTURE, BLOOD (ROUTINE X 2)     Status: None   Collection Time    05/06/14  9:00 PM      Result Value Ref Range Status   Specimen Description BLOOD HAND LEFT   Final   Special Requests BOTTLES DRAWN AEROBIC ONLY 8CC   Final   Culture  Setup Time     Final   Value: 05/07/2014 01:18     Performed at [facility]   Culture     Final   Value:        BLOOD CULTURE RECEIVED NO GROWTH TO DATE CULTURE WILL BE HELD FOR 5 DAYS BEFORE ISSUING A FINAL NEGATIVE REPORT     Performed at [facility]   Report Status PENDING   Incomplete  MRSA  PCR SCREENING     Status: Abnormal   Collection Time    05/07/14 12:35 PM      Result Value Ref Range Status   MRSA by PCR POSITIVE (*)  NEGATIVE Final   Comment:            The GeneXpert MRSA Assay (FDA     approved for NASAL specimens     only), is one component of a     comprehensive MRSA colonization     surveillance program. It is not     intended to diagnose MRSA     infection nor to guide or     monitor treatment for     MRSA infections.     RESULT CALLED TO, READ BACK BY AND VERIFIED WITH:     [place] RN 14:05 05/07/14 ([email or web address])  CULTURE, RESPIRATORY (NON-EXPECTORATED)     Status: None   Collection Time    05/09/14  9:15 AM      Result Value Ref Range Status   Specimen Description TRACHEAL ASPIRATE   Final   Special Requests NONE   Final   Gram Stain     Final   Value: FEW WBC PRESENT, PREDOMINANTLY PMN     NO SQUAMOUS EPITHELIAL CELLS SEEN     RARE GRAM NEGATIVE RODS     Performed at [facility]   Culture     Final   Value: MODERATE GRAM NEGATIVE RODS     Performed at [facility]   Report Status PENDING   Incomplete     Studies:  Recent x-ray studies have been reviewed in detail by the Attending Physician  Time spent :  35 mins    **If unable to reach the above provider after paging please contact the Flow Manager @ [phone]  On-Call/Text Page:      [email or web address]      password [email or web address]  If 7PM-7AM, please contact night-coverage [email or web address] Password [email or web address] 05/11/2014, 4:53 PM   LOS: 5 days

## 2014-05-12 ENCOUNTER — Inpatient Hospital Stay (HOSPITAL_COMMUNITY): Payer: Medicare Other

## 2014-05-12 DIAGNOSIS — I517 Cardiomegaly: Secondary | ICD-10-CM

## 2014-05-12 LAB — GLUCOSE, CAPILLARY
GLUCOSE-CAPILLARY: 263 mg/dL — AB (ref 70–99)
GLUCOSE-CAPILLARY: 210 mg/dL — AB (ref 70–99)
Glucose-Capillary: 237 mg/dL — ABNORMAL HIGH (ref 70–99)
Glucose-Capillary: 239 mg/dL — ABNORMAL HIGH (ref 70–99)
Glucose-Capillary: 253 mg/dL — ABNORMAL HIGH (ref 70–99)
Glucose-Capillary: 264 mg/dL — ABNORMAL HIGH (ref 70–99)

## 2014-05-12 LAB — PRO B NATRIURETIC PEPTIDE: Pro B Natriuretic peptide (BNP): 3477 pg/mL — ABNORMAL HIGH (ref 0–450)

## 2014-05-12 MED ORDER — LEVALBUTEROL HCL 1.25 MG/0.5ML IN NEBU
1.2500 mg | INHALATION_SOLUTION | Freq: Four times a day (QID) | RESPIRATORY_TRACT | Status: DC
  Administered 2014-05-12: 1.25 mg via RESPIRATORY_TRACT
  Filled 2014-05-12 (×4): qty 0.5

## 2014-05-12 MED ORDER — LEVALBUTEROL HCL 1.25 MG/0.5ML IN NEBU
1.2500 mg | INHALATION_SOLUTION | Freq: Four times a day (QID) | RESPIRATORY_TRACT | Status: DC
  Administered 2014-05-12: 1.25 mg via RESPIRATORY_TRACT
  Filled 2014-05-12 (×3): qty 0.5

## 2014-05-12 MED ORDER — SODIUM CHLORIDE 0.9 % IV SOLN
500.0000 mg | Freq: Two times a day (BID) | INTRAVENOUS | Status: DC
  Administered 2014-05-13 – 2014-05-15 (×5): 500 mg via INTRAVENOUS
  Filled 2014-05-12 (×6): qty 0.5

## 2014-05-12 MED ORDER — LEVALBUTEROL HCL 1.25 MG/0.5ML IN NEBU
1.2500 mg | INHALATION_SOLUTION | RESPIRATORY_TRACT | Status: DC | PRN
  Filled 2014-05-12: qty 0.5

## 2014-05-12 MED ORDER — LEVALBUTEROL HCL 1.25 MG/0.5ML IN NEBU
1.2500 mg | INHALATION_SOLUTION | Freq: Three times a day (TID) | RESPIRATORY_TRACT | Status: DC
  Administered 2014-05-13 – 2014-05-15 (×8): 1.25 mg via RESPIRATORY_TRACT
  Filled 2014-05-12 (×10): qty 0.5

## 2014-05-12 MED ORDER — FUROSEMIDE 10 MG/ML IJ SOLN
20.0000 mg | Freq: Once | INTRAMUSCULAR | Status: AC
  Administered 2014-05-12: 20 mg via INTRAVENOUS
  Filled 2014-05-12: qty 2

## 2014-05-12 NOTE — Progress Notes (Addendum)
Patient ID: Shawn Lamb, male   DOB: 17-Dec-1922, 80 y.o.   MRN: [id number]  [facility] PROGRESS NOTE  Chukwuemeka Artola YWV:[id number] DOB: 10/26/1922 DOA: 05/06/2014 PCP: Verneita Griffes, MD  Brief narrative: 80 year old male with COPD, dementia and diabetes, chronic foley and recurrent UTI's (currently treated with ertapenem for VRE UTI), resident of [facility] secondary to chronic tracheostomy, presented to [facility] 10/19 with AMS and acute urinary retention with blood clots noted in foley per nursing report. In ED, blood work notable for WBX 11.3K, Cr 4.8 (up from recent baseline 1.6), BUN 117.  Since admission he has received IV fluids with some improvement in renal function although this has not returned to baseline. Infectious disease service has been consulted and suspects reason for altered mentation is due to acute urinary retention and resultant renal failure but they do not suspect patient has acute UTI and his clinical state is more c/w colonization. Patient's overall long-term prognosis is poor and palliative medicine met with the patient's daughters on 10/21. At that time family opted to continue with aggressive medical interventions.   After rehydration patient has developed productive yellow-green sputum, sputum cultures been obtained. Urinalysis positive for greater than 100,000 colonies of yeast, one-time dose of Diflucan 200 mg IV given. Renal function slowly improving with aggressive hydration.   Assessment and Plan:   Sepsis secondary to UTI vs suspectedHCAP  - pt currently on Primaxin, still with T 99.3 F and even one reading of 96.7 F (10/25) - sepsis criteria met - WBC trending up: 14.7 --> 19.1 this AM, HR in 110's, RR 22 - 30 bpm, SBP 90's -  100's - last CXR 10/12 suggestive of PNA, will ask for repeat CXR this AM for further evaluation  - Blood cultures obtained on 05/06/2014 showing no growth to date  - stop IVF as pt is net 25 lbs over the  baseline weight Acute respiratory failure with hypoxia due to presumed HCAP  - this appears to be multifactorial in etiology and secondary to ? HCAP and imposed vascular congestion - pt appears volume overloaded on exam, weight trend since admission: 219 lbs --> 244 lbs this AM (10/25) - tracheal aspirate culture obtained on 10/22 and results are pending - continue Primaxin for now,  follow up on CXR, broaden the ABX coverage if indicated - also stop IVF to avoid further volume overload, give one dose of Lasix 20 mg IV and monitor clinical response  - 2 D ECHO requested for further evaluation Acute diastolic CHF - CXR pending this AM, 2 D EHCO requested - give one dose of Lasix 20 mg IV given soft BP - weight this AM 244 lbs - monitor daily weights, I's and O's Hypotension  - still soft this AM with SBP 90 -100's - pt on Metoprolol IV scheduled for tachycardia but his tachycardia is likely reaction to the infection as noted above as well beta agonist effect from BD's provided - will stop metoprolol and will change albuterol to levalbuterol  - hold IVF due to volume overload on exam and close monitoring of vitals as will give one dose of Lasix  Acute on chronic renal failure - unknown baseline as no previous records available - pt with multiple risk factors for CKD including hx of DM, HTN, age, race, with GFR in 10-20's during this admission - given the above, I suspect pt has at least CKD stage II - III - Cr is trending down since admission 4.5 --> 3.8 -->  3.0 --> 3.15 this AM, up in the past 24 hours despite aggressive IVF administration - will stop IVF as noted above and will continue to monitor renal function closely as I will give one dose of Lasix  Anemia of chronic disease, likely from DM and CKD - Hg slightly down over the past 24 hours: 8.2 --> 7.8 - no signs of active bleeding - repeat CBC in AM Diabetes mellitus with complications of CKD  - Hg A1c 7.4 - continue Levemir and  sliding scale insulin  Severe PCM - with underlying dysphagia - continue tube feeding per PEG  Acute functional quadriplegia - secondary to acute illnesses with complications outlined above - attempt PT/OT once pt more medically stable  Acute encephalopathy  - resolving slowly   DVT prophylaxis  Heparin SQ while pt is in hospital  Code Status: Full Family Communication: No family at bedside  Disposition Plan: Keep in SDU, will eventually return to SNF, family considering facility in [place]  IV Access:    PICC line right upper extremity (which was placed prior to admission) nonfunctional and was discontinued 10/21   PICC line left upper arm 10/21  Procedures and diagnostic studies:    US Renal 05/07/2014   No hydronephrosis in either kidney, chronic medical renal disease.     CXR 05/06/2014  Right PICC line seen in expected position, Trach tube in grossly good position, ? asbestos exposure.    CXR 05/08/2014  Left arm PICC extends to the level of the lower SVC, good position   CXR 05/08/2014    Left upper extremity PICC line placed, terminating in the SVC but may be extending into the azygos vein. New right perihilar patchy opacity could reflect atelectasis, aspiration, or pneumonia. Medical Consultants:    ID  PCT Other Consultants:    None  Anti-Infectives and microbiology data:   Cultures:   Blood cultures pending   Urine culture > 100,000 colonies yeast   Tracheal aspirate culture pending  Antibiotics:   Ceftriaxone x1 dose   Ertapenem x1 dose   Primaxin 10/20 >>>   Diflucan IV x1 dose  Faye Ramsay, MD  [facility] Pager [phone]  If 7PM-7AM, please contact night-coverage [email or web address] Password [email or web address] 05/12/2014, 10:29 AM   LOS: 6 days   HPI/Subjective: No events overnight.   Objective: Filed Vitals:   05/12/14 0435 05/12/14 0500 05/12/14 0759 05/12/14 0802  BP: 105/41  105/41 97/28  Pulse: 81  81 78  Temp: 96.7 F (35.9 C)   97.9 F  (36.6 C)  TempSrc: Axillary   Axillary  Resp: $Remo'18  21 18  'LQwVg$ Height:      Weight:  110.678 kg (244 lb)    SpO2: 94%  100%     Intake/Output Summary (Last 24 hours) at 05/12/14 1029 Last data filed at 05/12/14 0804  Gross per 24 hour  Intake    703 ml  Output   1175 ml  Net   -472 ml    Exam:   General:  Pt is somnolent but easy to arouse, follows some commands appropriately, not verbal this AM  Cardiovascular: Regular rate and rhythm, no rubs, no gallops  Respiratory: Course breath sounds bilaterally with crackles at bases, poor inspiratory effort   Abdomen: Soft, non tender, slightly  distended, bowel sounds present, no guarding  Extremities: + 1 bilateral LE pitting edema, pulses DP and PT palpable bilaterally  Data Reviewed: Basic Metabolic Panel:  Recent Labs Lab 05/07/14 0213 05/08/14 0258 05/09/14 0400 05/10/14  0650 05/11/14 1100  NA 133* 140 144 143 143  K 4.6 4.3 4.1 4.5 4.3  CL 95* 102 109 108 108  CO2 $Re'23 22 22 24 22  'foX$ GLUCOSE 333* 151* 72 194* 280*  BUN 126* 123* 103* 97* 97*  CREATININE 4.51* 3.84* 3.09* 3.05* 3.15*  CALCIUM 8.8 9.1 9.1 9.1 8.9   Liver Function Tests:  Recent Labs Lab 05/07/14 0213  AST 19  ALT 29  ALKPHOS 108  BILITOT 0.3  PROT 6.1  ALBUMIN 1.7*   CBC:  Recent Labs Lab 05/06/14 1936  05/08/14 0923 05/08/14 1600 05/09/14 0500 05/10/14 0650 05/11/14 1100  WBC 11.3*  < > 10.3 10.2 11.7* 14.7* 19.1*  NEUTROABS 10.2*  --   --   --  9.2*  --   --   HGB 8.1*  < > 8.4* 9.1* 8.5* 8.2* 7.8*  HCT 25.4*  < > 25.9* 28.7* 25.9* 25.9* 24.9*  MCV 84.4  < > 82.2 83.9 83.8 83.0 84.7  PLT 185  < > 228 73* 206 219 273  < > = values in this interval not displayed. Cardiac Enzymes:  Recent Labs Lab 05/07/14 0213 05/07/14 0753  TROPONINI <0.30 <0.30   CBG:  Recent Labs Lab 05/11/14 1525 05/11/14 2016 05/11/14 2353 05/12/14 0433 05/12/14 0758  GLUCAP 270* 257* 253* 264* 263*    Recent Results (from the past 240 hour(s))   URINE CULTURE     Status: None   Collection Time    05/06/14  7:04 PM      Result Value Ref Range Status   Specimen Description URINE, CLEAN CATCH   Final   Special Requests NONE   Final   Culture  Setup Time     Final   Value: 05/06/2014 20:38     Performed at [place]     Final   Value: >=100,000 COLONIES/ML     Performed at [facility]   Culture     Final   Value: YEAST     Performed at [facility]   Report Status 05/08/2014 FINAL   Final  CULTURE, BLOOD (ROUTINE X 2)     Status: None   Collection Time    05/06/14  8:52 PM      Result Value Ref Range Status   Specimen Description BLOOD ARM LEFT   Final   Special Requests BOTTLES DRAWN AEROBIC AND ANAEROBIC 10CC   Final   Culture  Setup Time     Final   Value: 05/07/2014 01:18     Performed at [facility]   Culture     Final   Value:        BLOOD CULTURE RECEIVED NO GROWTH TO DATE CULTURE WILL BE HELD FOR 5 DAYS BEFORE ISSUING A FINAL NEGATIVE REPORT     Performed at [facility]   Report Status PENDING   Incomplete  CULTURE, BLOOD (ROUTINE X 2)     Status: None   Collection Time    05/06/14  9:00 PM      Result Value Ref Range Status   Specimen Description BLOOD HAND LEFT   Final   Special Requests BOTTLES DRAWN AEROBIC ONLY 8CC   Final   Culture  Setup Time     Final   Value: 05/07/2014 01:18     Performed at [facility]   Culture     Final   Value:  BLOOD CULTURE RECEIVED NO GROWTH TO DATE CULTURE WILL BE HELD FOR 5 DAYS BEFORE ISSUING A FINAL NEGATIVE REPORT     Performed at [facility]   Report Status PENDING   Incomplete  MRSA PCR SCREENING     Status: Abnormal   Collection Time    05/07/14 12:35 PM      Result Value Ref Range Status   MRSA by PCR POSITIVE (*) NEGATIVE Final   Comment:            The GeneXpert MRSA Assay (FDA     approved for NASAL specimens     only), is one component of a     comprehensive MRSA  colonization     surveillance program. It is not     intended to diagnose MRSA     infection nor to guide or     monitor treatment for     MRSA infections.     RESULT CALLED TO, READ BACK BY AND VERIFIED WITH:     WELLINGTON RN 14:05 05/07/14 ([email or web address])  CULTURE, RESPIRATORY (NON-EXPECTORATED)     Status: None   Collection Time    05/09/14  9:15 AM      Result Value Ref Range Status   Specimen Description TRACHEAL ASPIRATE   Final   Special Requests NONE   Final   Gram Stain     Final   Value: FEW WBC PRESENT, PREDOMINANTLY PMN     NO SQUAMOUS EPITHELIAL CELLS SEEN     RARE GRAM NEGATIVE RODS     Performed at [facility]   Culture     Final   Value: MODERATE GRAM NEGATIVE RODS     Performed at [facility]   Report Status PENDING   Incomplete     Scheduled Meds: . dexamethasone  4 mg Per Tube Daily  . free water  250 mL Per Tube Q6H  . heparin subcutaneous  5,000 Units Subcutaneous 3 times per day  . imipenem-cilastatin  250 mg Intravenous Q12H  . insulin aspart  0-9 Units Subcutaneous 6 times per day  . insulin detemir  10 Units Subcutaneous Daily  . ipratropium-albuterol  3 mL Nebulization Q6H  . metoprolol  5 mg Intravenous Q4H  . pantoprazole IV  40 mg Intravenous Q12H  . senna  1 tablet Per Tube Daily   Continuous Infusions: . sodium chloride 50 mL/hr at 05/11/14 1152  . feeding supplement (NEPRO CARB STEADY) 1,000 mL (05/12/14 0940)

## 2014-05-12 NOTE — Progress Notes (Signed)
  Echocardiogram 2D Echocardiogram has been performed.  Georgian CoWILLIAMS, Malyiah Fellows 05/12/2014, 4:04 PM

## 2014-05-13 LAB — CULTURE, BLOOD (ROUTINE X 2)
CULTURE: NO GROWTH
Culture: NO GROWTH

## 2014-05-13 LAB — BASIC METABOLIC PANEL
Anion gap: 12 (ref 5–15)
BUN: 96 mg/dL — ABNORMAL HIGH (ref 6–23)
CALCIUM: 9.4 mg/dL (ref 8.4–10.5)
CO2: 23 mEq/L (ref 19–32)
Chloride: 108 mEq/L (ref 96–112)
Creatinine, Ser: 3.1 mg/dL — ABNORMAL HIGH (ref 0.50–1.35)
GFR, EST AFRICAN AMERICAN: 19 mL/min — AB (ref >90)
GFR, EST NON AFRICAN AMERICAN: 16 mL/min — AB (ref >90)
GLUCOSE: 301 mg/dL — AB (ref 70–99)
Potassium: 4.3 mEq/L (ref 3.7–5.3)
SODIUM: 143 meq/L (ref 137–147)

## 2014-05-13 LAB — CBC
HCT: 24.1 % — ABNORMAL LOW (ref 39.0–52.0)
HEMOGLOBIN: 7.6 g/dL — AB (ref 13.0–17.0)
MCH: 27.1 pg (ref 26.0–34.0)
MCHC: 31.5 g/dL (ref 30.0–36.0)
MCV: 86.1 fL (ref 78.0–100.0)
Platelets: 310 10*3/uL (ref 150–400)
RBC: 2.8 MIL/uL — ABNORMAL LOW (ref 4.22–5.81)
RDW: 16.5 % — AB (ref 11.5–15.5)
WBC: 14.9 10*3/uL — ABNORMAL HIGH (ref 4.0–10.5)

## 2014-05-13 LAB — GLUCOSE, CAPILLARY
GLUCOSE-CAPILLARY: 296 mg/dL — AB (ref 70–99)
GLUCOSE-CAPILLARY: 309 mg/dL — AB (ref 70–99)
Glucose-Capillary: 243 mg/dL — ABNORMAL HIGH (ref 70–99)
Glucose-Capillary: 158 mg/dL — ABNORMAL HIGH (ref 70–99)
Glucose-Capillary: 192 mg/dL — ABNORMAL HIGH (ref 70–99)

## 2014-05-13 LAB — CULTURE, RESPIRATORY

## 2014-05-13 LAB — CULTURE, RESPIRATORY W GRAM STAIN

## 2014-05-13 MED ORDER — DIPHENHYDRAMINE HCL 12.5 MG/5ML PO LIQD
25.0000 mg | Freq: Four times a day (QID) | ORAL | Status: DC | PRN
  Filled 2014-05-13: qty 10

## 2014-05-13 MED ORDER — ACETAMINOPHEN 650 MG RE SUPP: 650.0000 mg | Freq: Four times a day (QID) | RECTAL | Status: DC | PRN

## 2014-05-13 MED ORDER — ACETAMINOPHEN 325 MG PO TABS: 650.0000 mg | ORAL_TABLET | Freq: Four times a day (QID) | ORAL | Status: DC | PRN

## 2014-05-13 MED ORDER — INSULIN DETEMIR 100 UNIT/ML ~~LOC~~ SOLN
20.0000 [IU] | Freq: Every day | SUBCUTANEOUS | Status: DC
  Administered 2014-05-14 – 2014-05-15 (×2): 20 [IU] via SUBCUTANEOUS
  Filled 2014-05-13 (×2): qty 0.2

## 2014-05-13 MED ORDER — INSULIN DETEMIR 100 UNIT/ML ~~LOC~~ SOLN
10.0000 [IU] | Freq: Every day | SUBCUTANEOUS | Status: DC
  Administered 2014-05-13 – 2014-05-14 (×2): 10 [IU] via SUBCUTANEOUS
  Filled 2014-05-13 (×3): qty 0.1

## 2014-05-13 MED ORDER — MUPIROCIN 2 % EX OINT: 1.0000 "application " | TOPICAL_OINTMENT | Freq: Two times a day (BID) | CUTANEOUS | Status: DC

## 2014-05-13 MED ORDER — CHLORHEXIDINE GLUCONATE CLOTH 2 % EX PADS: 6.0000 | MEDICATED_PAD | Freq: Every day | CUTANEOUS | Status: DC

## 2014-05-13 NOTE — Progress Notes (Signed)
[facility]    Date of Admission:  05/06/2014   Total days of antibiotics 7        Day 6 carbapenem          ID: Shawn Lamb is a 80 y.o. male with LTACH resident colonized with MDRO presented  With sepsis from presumed urinary source. Outside cx showed MDR ecoli and amp S enterococcus. Repeat blood cx negative. Repeat urine cx yeast, and respiratory cx showing stenotrophomonas  Principal Problem:   Sepsis secondary to UTI Active Problems:   UTI (lower urinary tract infection)   COPD (chronic obstructive pulmonary disease)   Anemia   Tracheostomy dependent   Diabetes mellitus without complication   CHF (congestive heart failure)   Acute encephalopathy    Subjective: Echo done yesterday showed mild LVH and EF preserved at 55%, corresponds to BNP 3000s and cxr suggestive of vascular congestion  Medications:  . antiseptic oral rinse  7 mL Mouth Rinse QID  . chlorhexidine  15 mL Mouth/Throat BID  . dexamethasone  4 mg Per Tube Daily  . free water  250 mL Per Tube Q6H  . heparin subcutaneous  5,000 Units Subcutaneous 3 times per day  . insulin aspart  0-9 Units Subcutaneous 6 times per day  . insulin detemir  10 Units Subcutaneous Daily  . levalbuterol  1.25 mg Nebulization TID  . meropenem (MERREM) IV  500 mg Intravenous Q12H  . multivitamin with minerals  1 tablet Per Tube Daily  . pantoprazole (PROTONIX) IV  40 mg Intravenous Q12H  . senna  1 tablet Per Tube Daily  . sodium chloride  10-40 mL Intracatheter Q12H  . sodium chloride  3 mL Intravenous Q12H  . vitamin C  500 mg Per Tube Daily    Objective: Vital signs in last 24 hours: Temp:  [97.1 F (36.2 C)-98 F (36.7 C)] 97.5 F (36.4 C) (10/26 1257) Pulse Rate:  [65-95] 92 (10/26 1531) Resp:  [16-33] 22 (10/26 1531) BP: (93-201)/(27-179) 143/108 mmHg (10/26 1531) SpO2:  [92 %-100 %] 99 % (10/26 1531) FiO2 (%):  [30 %] 30 % (10/26 1531) Weight:  [244 lb (110.678 kg)] 244 lb  (110.678 kg) (10/26 0400)  gen = chronically ill. Sleeping Neck = trach in place cors= nl s2,s2 Pulm= cTAB Abd= decrease BS Lab Results  Recent Labs  05/11/14 1100 05/13/14 0500  WBC 19.1* 14.9*  HGB 7.8* 7.6*  HCT 24.9* 24.1*  NA 143 143  K 4.3 4.3  CL 108 108  CO2 22 23  BUN 97* 96*  CREATININE 3.15* 3.10*    Microbiology: resp cx = steno R bactrim S ceftaz S mino Blood cx NGTD  Studies/Results: Dg Chest Port 1 View  05/12/2014   CLINICAL DATA:  80 year old male with dyspnea.  EXAM: PORTABLE CHEST - 1 VIEW  COMPARISON:  Chest x-ray 05/08/2014.  FINDINGS: Tracheostomy tube in position with tip approximately 8.3 cm above the carina. There is a Left upper extremity PICC with tip terminating in the mid superior vena cava. Again noted are patchy areas of interstitial prominence and ill-defined airspace disease, throughout the mid to lower lungs bilaterally, most confluent in the right perihilar region extending to the periphery of the right lung, where there is some overlying pleural thickening between the right fourth and fifth ribs. Heart size and mediastinal contours are within normal limits. Atherosclerosis in the thoracic aorta.  IMPRESSION: 1. Support apparatus, as above. 2. Findings are concerning for multilobar  bronchopneumonia, possibly with a tiny developing peripherally loculated pleural effusion in the lateral right hemithorax. 3. Atherosclerosis.   Electronically Signed   By: Trudie Reedaniel  Entrikin M.D.   On: 05/12/2014 12:53     Assessment/Plan: Complicated uti = would finished total of 10 days for complicated uti. Currently on day 8 including treatment prior to admit. Finish 2 addn days of meropenem  Colonization with stenotrophomonas = chest xray appears more consistent with pulm edema. Consider holding off treatment of stenotrophomonas unless we see increased work of breathing or increase oxygenation needs. Linna CapriceSteno tends to easily develop resistance. At this stage, not  completely convinced this is early hcap.  Drue SecondSNIDER, [facility] Cell: [phone] Pager: [phone]  05/13/2014, 3:50 PM

## 2014-05-13 NOTE — Progress Notes (Signed)
[name]  Ova Meegan VZC:[id number] DOB: 01/19/1923 DOA: 05/06/2014 PCP: Verneita Griffes, MD  Brief narrative: 80 year old male patient with known history of COPD, dementia, and diabetes. [facility] of [facility] secondary to chronic tracheostomy. He also has a chronic Foley catheter. He was sent to the emergency department because of altered mentation and apparent urinary retention. The nursing staff there had noticed decreased output and blood clots in the Foley catheter with associated altered mentation. This patient apparently has a history of recurrent UTI and was being treated for VRE UTI with ertapenem. According to the records from [place] he also has had progressive acute renal failure with severe azotemia.  Upon arrival to [facility] he was found to have a leukocytosis of 11,300. His creatinine had increased from a baseline of 1.6 to 4.8 and he was tachycardic. He also had azotemia with BUN of 117.  Since admission he has undergone IV fluid hydration with some improvement in his renal function although this has not returned to baseline. Infectious Disease Service has been consulted and suspects reason for altered mentation secondary to acute urinary retention and resultant renal failure but they do not suspect patient has acute UTI and his picture is more consistent with colonization. Patient's overall long-term prognosis is poor and Palliative Medicine met with the patient's daughters on 10/21. At that time family opted to continue with aggressive medical interventions.   After rehydration patient developed productive yellow-green sputum. Urinalysis positive for greater than 100,000 colonies of yeast, one-time dose of Diflucan 200 mg IV given. Renal function slowly improving with aggressive hydration.   HPI/Subjective: Pt is alert and interactive, though his speech is very difficult to decipher.  He does not appear to be in acute  distress.    Assessment/Plan:  Sepsis secondary to UTI vs suspected HCAP  - pt currently on empiric abx tx  - sepsis criteria met  - WBC trending back down this AM but remains elevated - CXR 10/25 suggestive of multilobar bronchopneumia - follow ? developing effusion on R  - Blood cultures obtained on 05/06/2014 showing no growth  - moderate stenotroph maltophilia on sputum cx sensitive to ceftaz  Acute respiratory failure with hypoxia due to presumed HCAP  - this appears to be multifactorial in etiology and secondary to ? HCAP and imposed vascular congestion  - pt appears volume overloaded on exam, weight increased since admit but now stable at 244# - continue IV abx coverage  - stopped IVF to avoid further volume overload - 2 D ECHO revealed preserved LV systolic fxn    Acute diastolic CHF  - was noted to be volume overloaded on 10/25 - given one dose of Lasix 20 mg IV at that time   - weight 244 lbs  - monitor daily weights, I's and O's   Hypotension  - BPs appear to be stabilizing  - now off IVF    Acute on chronic renal failure  - unknown baseline as no previous records available  - pt with multiple risk factors for CKD including hx of DM, HTN, age, race, with GFR in 10-20's during this admission  - given the above, I suspect pt has at least CKD stage II - III  - Cr is trending down since admission 4.5 --> 3.8 --> 3.0 --> 3.10 this AM - continue to monitor renal function closely    Anemia of chronic disease, likely from DM and CKD  - Hg slightly down  over the past 24 hours: 8.2 > 7.8 > 7.6 - no signs of active bleeding  - repeat CBC in AM   Diabetes mellitus with complications of CKD  - Hg A1c 7.4  - continue Levemir and sliding scale insulin but adjust as CBG well above desired range  Severe PCM  - with underlying dysphagia  - continue tube feeding per PEG   Acute functional quadriplegia  - secondary to acute illnesses with complications outlined above  -  attempt PT/OT once pt more medically stable   Acute encephalopathy  - resolving slowly   Tracheostomy dependent / COPD  - Currently on full ventilatory support but oxygen weaned to 30%  Dysphagia - tube feeding per PEG  MRSA screen +  DVT prophylaxis: Subcutaneous heparin Code Status: Full Family Communication: No family at bedside Disposition Plan/Expected LOS: Stepdown - eventual return to [facility] pending Palliative evaluation (?Tues or Wednesday)  Consultants: Palliative Medicine Infectious Disease  Antibiotics: Ceftriaxone x1  Ertapenem x1  Imipenem 10/20 > 10/25 Meropenem 10/26 >  Objective: Blood pressure 117/79, pulse 92, temperature 97.5 F (36.4 C), temperature source Axillary, resp. rate 30, height 6' (1.829 m), weight 110.678 kg (244 lb), SpO2 99.00%.  Intake/Output Summary (Last 24 hours) at 05/13/14 1515 Last data filed at 05/13/14 1259  Gross per 24 hour  Intake    350 ml  Output   2050 ml  Net  -1700 ml   Exam: Gen: No acute respiratory distress Chest: no wheezes appreciated - mild basilar crackles - trach in place w/o complications  Cardiac: Regular rate and rhythm, no rubs murmurs or gallops Abdomen: Soft nontender nondistended without obvious hepatosplenomegaly, no ascites - PEG tube insertion clean and dry  Extremities: no signif c/c/e B LE   Scheduled Meds:  Scheduled Meds: . antiseptic oral rinse  7 mL Mouth Rinse QID  . chlorhexidine  15 mL Mouth/Throat BID  . dexamethasone  4 mg Per Tube Daily  . free water  250 mL Per Tube Q6H  . heparin subcutaneous  5,000 Units Subcutaneous 3 times per day  . insulin aspart  0-9 Units Subcutaneous 6 times per day  . insulin detemir  10 Units Subcutaneous Daily  . levalbuterol  1.25 mg Nebulization TID  . meropenem (MERREM) IV  500 mg Intravenous Q12H  . multivitamin with minerals  1 tablet Per Tube Daily  . pantoprazole (PROTONIX) IV  40 mg Intravenous Q12H  . senna  1 tablet Per Tube Daily  .  sodium chloride  10-40 mL Intracatheter Q12H  . sodium chloride  3 mL Intravenous Q12H  . vitamin C  500 mg Per Tube Daily    Data Reviewed: Basic Metabolic Panel:  Recent Labs Lab 05/08/14 0258 05/09/14 0400 05/10/14 0650 05/11/14 1100 05/13/14 0500  NA 140 144 143 143 143  K 4.3 4.1 4.5 4.3 4.3  CL 102 109 108 108 108  CO2 _0 GLUCOSE 151* 72 194* 280* 301*  BUN 123* 103* 97* 97* 96*  CREATININE 3.84* 3.09* 3.05* 3.15* 3.10*  CALCIUM 9.1 9.1 9.1 8.9 9.4   Liver Function Tests:  Recent Labs Lab 05/07/14 0213  AST 19  ALT 29  ALKPHOS 108  BILITOT 0.3  PROT 6.1  ALBUMIN 1.7*   CBC:  Recent Labs Lab 05/06/14 1936  05/08/14 1600 05/09/14 0500 05/10/14 0650 05/11/14 1100 05/13/14 0500  WBC 11.3*  < > 10.2 11.7* 14.7* 19.1* 14.9*  NEUTROABS 10.2*  --   --  9.2*  --   --   --   HGB 8.1*  < > 9.1* 8.5* 8.2* 7.8* 7.6*  HCT 25.4*  < > 28.7* 25.9* 25.9* 24.9* 24.1*  MCV 84.4  < > 83.9 83.8 83.0 84.7 86.1  PLT 185  < > 73* 206 219 273 310  < > = values in this interval not displayed.  Cardiac Enzymes:  Recent Labs Lab 05/07/14 0213 05/07/14 0753  TROPONINI <0.30 <0.30   BNP (last 3 results)  Recent Labs  05/07/14 0213 05/07/14 1317 05/12/14 1100  PROBNP 3433.0* 2701.0* 3477.0*   CBG:  Recent Labs Lab 05/12/14 1602 05/12/14 2010 05/13/14 0043 05/13/14 0459 05/13/14 0847  GLUCAP 239* 210* 243* 296* 309*    Recent Results (from the past 240 hour(s))  URINE CULTURE     Status: None   Collection Time    05/06/14  7:04 PM      Result Value Ref Range Status   Specimen Description URINE, CLEAN CATCH   Final   Special Requests NONE   Final   Culture  Setup Time     Final   Value: 05/06/2014 20:38     Performed at [place]     Final   Value: >=100,000 COLONIES/ML     Performed at [facility]   Culture     Final   Value: YEAST     Performed at [facility]   Report Status 05/08/2014  FINAL   Final  CULTURE, BLOOD (ROUTINE X 2)     Status: None   Collection Time    05/06/14  8:52 PM      Result Value Ref Range Status   Specimen Description BLOOD ARM LEFT   Final   Special Requests BOTTLES DRAWN AEROBIC AND ANAEROBIC 10CC   Final   Culture  Setup Time     Final   Value: 05/07/2014 01:18     Performed at [facility]   Culture     Final   Value: NO GROWTH 5 DAYS     Performed at [facility]   Report Status 05/13/2014 FINAL   Final  CULTURE, BLOOD (ROUTINE X 2)     Status: None   Collection Time    05/06/14  9:00 PM      Result Value Ref Range Status   Specimen Description BLOOD HAND LEFT   Final   Special Requests BOTTLES DRAWN AEROBIC ONLY 8CC   Final   Culture  Setup Time     Final   Value: 05/07/2014 01:18     Performed at [facility]   Culture     Final   Value: NO GROWTH 5 DAYS     Performed at [facility]   Report Status 05/13/2014 FINAL   Final  MRSA PCR SCREENING     Status: Abnormal   Collection Time    05/07/14 12:35 PM      Result Value Ref Range Status   MRSA by PCR POSITIVE (*) NEGATIVE Final   Comment:            The GeneXpert MRSA Assay (FDA     approved for NASAL specimens     only), is one component of a     comprehensive MRSA colonization     surveillance program. It is not     intended to diagnose MRSA     infection nor to guide or  monitor treatment for     MRSA infections.     RESULT CALLED TO, READ BACK BY AND VERIFIED WITH:     WELLINGTON RN 14:05 05/07/14 ([email or web address])  CULTURE, RESPIRATORY (NON-EXPECTORATED)     Status: None   Collection Time    05/09/14  9:15 AM      Result Value Ref Range Status   Specimen Description TRACHEAL ASPIRATE   Final   Special Requests NONE   Final   Gram Stain     Final   Value: FEW WBC PRESENT, PREDOMINANTLY PMN     NO SQUAMOUS EPITHELIAL CELLS SEEN     RARE GRAM NEGATIVE RODS     Performed at [facility]   Culture     Final   Value: MODERATE  STENOTROPHOMONAS MALTOPHILIA     Performed at [facility]   Report Status 05/13/2014 FINAL   Final   Organism ID, Bacteria STENOTROPHOMONAS MALTOPHILIA   Final     Studies:  Recent x-ray studies have been reviewed in detail by the Attending Physician  Time spent :  16 mins  Cherene Altes, MD [facility] For Consults/Admissions - Flow Manager - [phone] Office  [phone] Pager [phone]  On-Call/Text Page:      [email or web address]      password [facility]  05/13/2014, 3:15 PM   LOS: 7 days

## 2014-05-13 NOTE — Clinical Social Work Note (Signed)
CSW attempted to contact Danise MinaMary Shivam (patients daughter)- left message.  CSW spoke with Gigi Gineggy, patients other daughter, who stated that Corrie DandyMary was the one looking into the different facilities and stated that I should speak with her.  CSW will continue to follow.  Merlyn LotJenna Holoman, LCSWA Clinical Social Worker [phone]

## 2014-05-13 NOTE — Progress Notes (Signed)
Patient OZ:[id number]:Shawn Lamb      DOB: 29-Oct-1922      NGE:[id number]  No daughters at the bedside.  Patient remains vent dependent.  [facility]. They were not open to a palliative course. Will attempt to reengage with the daughters.   Kanai Berrios L. Ladona Ridgelaylor, MD MBA [facility] Pall[facility] Medicine Team at [facility] Team Phone: [phone] Pager: [phone] ( Use team phone after hours)

## 2014-05-13 NOTE — Progress Notes (Addendum)
NUTRITION FOLLOW UP  INTERVENTION:  Continue current TF regimen RD to follow for nutrition care plan  NUTRITION DIAGNOSIS: Inadequate oral intake related to inability to eat as evidenced by NPO status, ongoing  Goal: Pt to meet >/= 90% of their estimated nutrition needs, met  Monitor:  TF regimen & tolerance, goals of care, weight, labs, I/O's  ASSESSMENT: 80 y.o. Male from [facility] with PMH of COPD, tracheostomy dependence, dysphagia, dementia, diabetes, CHF, who presented with AMS and urinary retention.  Patient is currently on ventilator support via trach MV: 10.9 L/min Temp (24hrs), Avg:97.8 F (36.6 C), Min:97.1 F (36.2 C), Max:98.3 F (36.8 C)   Nepro formula currently infusing at goal rate of 50 ml/hr via PEG tube providing 2160 kcals, 96 gm protein, 872 ml of free water.  Tolerating well.  Free water flushes at 250 ml every 6 hours.  [place] Team notes reviewed 10/21.  Height: Ht Readings from Last 1 Encounters:  05/07/14 6' (1.829 m)    Weight: Wt Readings from Last 1 Encounters:  05/13/14 244 lb (110.678 kg)    BMI:  Body mass index is 33.09 kg/(m^2).  Re-estimated Nutritional Needs: Kcal: 2000-2200 Protein: 100-110 gm Fluid: per MD  Skin:  Stage II wound to inner buttocks near gluteal fold  Stage III wound to left buttocks  Diet Order: NPO   Intake/Output Summary (Last 24 hours) at 05/13/14 1137 Last data filed at 05/13/14 0846  Gross per 24 hour  Intake   1550 ml  Output   1725 ml  Net   -175 ml   Labs:   Recent Labs Lab 05/10/14 0650 05/11/14 1100 05/13/14 0500  NA 143 143 143  K 4.5 4.3 4.3  CL 108 108 108  CO2 $Re'24 22 23  'RUa$ BUN 97* 97* 96*  CREATININE 3.05* 3.15* 3.10*  CALCIUM 9.1 8.9 9.4  GLUCOSE 194* 280* 301*    CBG (last 3)   Recent Labs  05/13/14 0043 05/13/14 0459 05/13/14 0847  GLUCAP 243* 296* 309*    Scheduled Meds: . antiseptic oral rinse  7 mL Mouth Rinse QID  . chlorhexidine  15 mL  Mouth/Throat BID  . dexamethasone  4 mg Per Tube Daily  . free water  250 mL Per Tube Q6H  . heparin subcutaneous  5,000 Units Subcutaneous 3 times per day  . insulin aspart  0-9 Units Subcutaneous 6 times per day  . insulin detemir  10 Units Subcutaneous Daily  . levalbuterol  1.25 mg Nebulization TID  . meropenem (MERREM) IV  500 mg Intravenous Q12H  . multivitamin with minerals  1 tablet Per Tube Daily  . pantoprazole (PROTONIX) IV  40 mg Intravenous Q12H  . senna  1 tablet Per Tube Daily  . sodium chloride  10-40 mL Intracatheter Q12H  . sodium chloride  3 mL Intravenous Q12H  . vitamin C  500 mg Per Tube Daily    Continuous Infusions: . feeding supplement (NEPRO CARB STEADY) 1,000 mL (05/13/14 0135)    Past Medical History  Diagnosis Date  . COPD (chronic obstructive pulmonary disease)   . Anemia   . Renal disorder   . Dysphasia   . Tracheostomy dependent   . Diabetes mellitus without complication   . CHF (congestive heart failure)     History reviewed. No pertinent past surgical history.  Arthur Holms, RD, LDN Pager #: [phone] After-Hours Pager #: [phone]

## 2014-05-13 NOTE — Progress Notes (Deleted)
Lab called with critical values, potassium extremely elevated since ED draw. CMP ordered STAT to confirm levels.

## 2014-05-13 NOTE — Progress Notes (Signed)
Inpatient Diabetes Program Recommendations  AACE/ADA: New Consensus Statement on Inpatient Glycemic Control (2013)  Target Ranges:  Prepandial:   less than 140 mg/dL      Peak postprandial:   less than 180 mg/dL (1-2 hours)      Critically ill patients:  140 - 180 mg/dL   Reason for Visit: Hyperglycemia  Diabetes history: DM2  Outpatient Diabetes medications: Levemir 30 units daily, Levemir 10 units QHS, Novolog 5-20 units Q6H  Current orders for Inpatient glycemic control: Levemir 10 units daily, Novolog 0-9 units Q4H  Results for Shawn Lamb, [name] (MRN [id number]) as of 05/13/2014 08:54  Ref. Range 05/12/2014 04:33 05/12/2014 07:58 05/12/2014 12:03 05/12/2014 16:02 05/12/2014 20:10 05/13/2014 00:43 05/13/2014 04:59  Glucose-Capillary Latest Range: 70-99 mg/dL 295264 (H) 284263 (H) 132237 (H) 239 (H) 210 (H) 243 (H) 296 (H)   Note:  Please consider increasing Levemir to 15 units daily. Thank you.  [name] S. [name], RN, CNS, CDE Inpatient Diabetes Program, team pager [phone]

## 2014-05-14 ENCOUNTER — Inpatient Hospital Stay (HOSPITAL_COMMUNITY): Payer: Medicare Other

## 2014-05-14 DIAGNOSIS — E872 Acidosis: Secondary | ICD-10-CM

## 2014-05-14 DIAGNOSIS — J441 Chronic obstructive pulmonary disease with (acute) exacerbation: Secondary | ICD-10-CM

## 2014-05-14 DIAGNOSIS — I950 Idiopathic hypotension: Secondary | ICD-10-CM

## 2014-05-14 DIAGNOSIS — J9621 Acute and chronic respiratory failure with hypoxia: Secondary | ICD-10-CM

## 2014-05-14 DIAGNOSIS — Z1624 Resistance to multiple antibiotics: Secondary | ICD-10-CM

## 2014-05-14 DIAGNOSIS — E43 Unspecified severe protein-calorie malnutrition: Secondary | ICD-10-CM

## 2014-05-14 DIAGNOSIS — A498 Other bacterial infections of unspecified site: Secondary | ICD-10-CM

## 2014-05-14 LAB — CBC
HCT: 25.4 % — ABNORMAL LOW (ref 39.0–52.0)
HEMOGLOBIN: 7.9 g/dL — AB (ref 13.0–17.0)
MCH: 26.2 pg (ref 26.0–34.0)
MCHC: 31.1 g/dL (ref 30.0–36.0)
MCV: 84.4 fL (ref 78.0–100.0)
Platelets: 331 10*3/uL (ref 150–400)
RBC: 3.01 MIL/uL — ABNORMAL LOW (ref 4.22–5.81)
RDW: 16.6 % — ABNORMAL HIGH (ref 11.5–15.5)
WBC: 15.2 10*3/uL — AB (ref 4.0–10.5)

## 2014-05-14 LAB — BLOOD GAS, ARTERIAL
Acid-base deficit: 0.7 mmol/L (ref 0.0–2.0)
Bicarbonate: 24.5 mEq/L — ABNORMAL HIGH (ref 20.0–24.0)
Drawn by: 10552
FIO2: 0.3 %
O2 Saturation: 95 %
PCO2 ART: 47.8 mmHg — AB (ref 35.0–45.0)
PEEP/CPAP: 5 cmH2O
PH ART: 7.329 — AB (ref 7.350–7.450)
Patient temperature: 98.6
RATE: 16 resp/min
TCO2: 25.9 mmol/L (ref 0–100)
VT: 550 mL
pO2, Arterial: 73.5 mmHg — ABNORMAL LOW (ref 80.0–100.0)

## 2014-05-14 LAB — GLUCOSE, CAPILLARY
GLUCOSE-CAPILLARY: 197 mg/dL — AB (ref 70–99)
GLUCOSE-CAPILLARY: 184 mg/dL — AB (ref 70–99)
Glucose-Capillary: 169 mg/dL — ABNORMAL HIGH (ref 70–99)
Glucose-Capillary: 200 mg/dL — ABNORMAL HIGH (ref 70–99)
Glucose-Capillary: 205 mg/dL — ABNORMAL HIGH (ref 70–99)
Glucose-Capillary: 212 mg/dL — ABNORMAL HIGH (ref 70–99)
Glucose-Capillary: 238 mg/dL — ABNORMAL HIGH (ref 70–99)
Glucose-Capillary: 242 mg/dL — ABNORMAL HIGH (ref 70–99)

## 2014-05-14 LAB — COMPREHENSIVE METABOLIC PANEL
ALT: 15 U/L (ref 0–53)
AST: 11 U/L (ref 0–37)
Albumin: 1.7 g/dL — ABNORMAL LOW (ref 3.5–5.2)
Alkaline Phosphatase: 124 U/L — ABNORMAL HIGH (ref 39–117)
Anion gap: 12 (ref 5–15)
BUN: 89 mg/dL — ABNORMAL HIGH (ref 6–23)
CHLORIDE: 110 meq/L (ref 96–112)
CO2: 24 meq/L (ref 19–32)
Calcium: 9.8 mg/dL (ref 8.4–10.5)
Creatinine, Ser: 2.77 mg/dL — ABNORMAL HIGH (ref 0.50–1.35)
GFR calc Af Amer: 22 mL/min — ABNORMAL LOW (ref >90)
GFR, EST NON AFRICAN AMERICAN: 19 mL/min — AB (ref >90)
Glucose, Bld: 230 mg/dL — ABNORMAL HIGH (ref 70–99)
POTASSIUM: 4.6 meq/L (ref 3.7–5.3)
SODIUM: 146 meq/L (ref 137–147)
Total Bilirubin: 0.2 mg/dL — ABNORMAL LOW (ref 0.3–1.2)
Total Protein: 6.3 g/dL (ref 6.0–8.3)

## 2014-05-14 MED ORDER — ACETYLCYSTEINE 10% NICU INHALATION SOLUTION: 2.0000 mL | Freq: Three times a day (TID) | RESPIRATORY_TRACT | Status: DC

## 2014-05-14 MED ORDER — INSULIN ASPART 100 UNIT/ML ~~LOC~~ SOLN
0.0000 [IU] | SUBCUTANEOUS | Status: DC
  Administered 2014-05-14: 5 [IU] via SUBCUTANEOUS
  Administered 2014-05-15: 3 [IU] via SUBCUTANEOUS
  Administered 2014-05-15: 5 [IU] via SUBCUTANEOUS
  Administered 2014-05-15 (×2): 8 [IU] via SUBCUTANEOUS

## 2014-05-14 MED ORDER — FLUCONAZOLE 40 MG/ML PO SUSR
100.0000 mg | Freq: Every day | ORAL | Status: DC
  Administered 2014-05-15: 100 mg
  Filled 2014-05-14: qty 2.5

## 2014-05-14 MED ORDER — FLUCONAZOLE 40 MG/ML PO SUSR
200.0000 mg | Freq: Once | ORAL | Status: AC
  Administered 2014-05-14: 200 mg
  Filled 2014-05-14: qty 5

## 2014-05-14 MED ORDER — ACETYLCYSTEINE 20 % IN SOLN
3.0000 mL | Freq: Three times a day (TID) | RESPIRATORY_TRACT | Status: DC
  Administered 2014-05-14: 3 mL via RESPIRATORY_TRACT
  Administered 2014-05-14: 16:00:00 via RESPIRATORY_TRACT
  Administered 2014-05-15 (×2): 3 mL via RESPIRATORY_TRACT
  Filled 2014-05-14 (×6): qty 4

## 2014-05-14 MED ORDER — METHYLPREDNISOLONE SODIUM SUCC 125 MG IJ SOLR
60.0000 mg | Freq: Two times a day (BID) | INTRAMUSCULAR | Status: DC
  Administered 2014-05-14 – 2014-05-15 (×2): 60 mg via INTRAVENOUS
  Filled 2014-05-14 (×4): qty 0.96

## 2014-05-14 NOTE — Clinical Social Work Note (Addendum)
11:15am CSW received a call from patients daughter, Alexia Freestoneatty.  CSW reiterated what had been told to patients daughter Corrie DandyMary this morning.  Patients daughter stated that patient is a [employer] War [employer] veteran.  Daughter also stated that she had made a promise to her father to get him back to [facility] and now she feels like she is breaking that promise. CSW provided active listening and informed her that she is doing everything she can right now to keep that promise.  10:00am CSW spoke with patients daughter, Corrie DandyMary.  CS[facility]  Patients daughter explained that they applied for emergency medicaid on Friday 10/23 [facility] Corrie DandyMary is [facility] to ensure [facility] can come to [place] if they obtain emergency [place] medicaid.  CSW explained that the patient is unable to stay at the hospital while waiting for insurance approval.  Patients family is tentatively agreeable to [facility] while applying for emergency medicaid.  CSW will continue to follow.  Merlyn LotJenna Holoman, LCSWA Clinical Social Worker [phone]

## 2014-05-14 NOTE — Progress Notes (Signed)
[facility]    Date of Admission:  05/06/2014   Total days of antibiotics 8        Day 7 carbapenem          ID: Shawn Lamb is a 80 y.o. male with LTACH resident colonized with MDRO presented  With sepsis from presumed urinary source. Outside cx showed MDR ecoli and amp S enterococcus. Repeat blood cx negative. Repeat urine cx yeast, and respiratory cx showing stenotrophomonas.   Principal Problem:   Sepsis secondary to UTI Active Problems:   UTI (lower urinary tract infection)   COPD (chronic obstructive pulmonary disease)   Anemia   Tracheostomy dependent   Diabetes mellitus without complication   CHF (congestive heart failure)   Acute encephalopathy  Subjective: Appears more aware today. Mouthed that his daughter was the one at his bedside when asked who she was. Nodded that he was in the hospital. Daughter said pt is not at baseline. Could previously do most of his activities of daily living except cook. Lives with daughter. Daughter [place] take [place] to [place], when ever pt is stable for transfer. Highest temp since admission- 99.3. No requiring increased O2. PEEP- 5, FIO2- 30%.  Medications:  . antiseptic oral rinse  7 mL Mouth Rinse QID  . chlorhexidine  15 mL Mouth/Throat BID  . dexamethasone  4 mg Per Tube Daily  . free water  250 mL Per Tube Q6H  . heparin subcutaneous  5,000 Units Subcutaneous 3 times per day  . insulin aspart  0-9 Units Subcutaneous 6 times per day  . insulin detemir  10 Units Subcutaneous QHS  . insulin detemir  20 Units Subcutaneous Daily  . levalbuterol  1.25 mg Nebulization TID  . meropenem (MERREM) IV  500 mg Intravenous Q12H  . methylPREDNISolone (SOLU-MEDROL) injection  60 mg Intravenous Q12H  . multivitamin with minerals  1 tablet Per Tube Daily  . pantoprazole (PROTONIX) IV  40 mg Intravenous Q12H  . senna  1 tablet Per Tube Daily  . sodium chloride  10-40 mL Intracatheter Q12H  . sodium chloride  3  mL Intravenous Q12H  . vitamin C  500 mg Per Tube Daily    Objective: Vital signs in last 24 hours: Temp:  [97.4 F (36.3 C)-98.2 F (36.8 C)] 97.4 F (36.3 C) (10/27 0900) Pulse Rate:  [85-92] 90 (10/27 0900) Resp:  [16-30] 23 (10/27 0900) BP: (99-143)/(44-108) 113/76 mmHg (10/27 0900) SpO2:  [98 %-100 %] 100 % (10/27 0900) FiO2 (%):  [30 %] 30 % (10/27 0900) Weight:  [242 lb (109.77 kg)] 242 lb (109.77 kg) (10/27 0500)  Gen = Awake, responds to commands. Neck = trach in place cors= nl s2,s2 Pulm= cTAB Abd= decrease BS Extremities= moves all extremities voluntarily.   Lab Results  Recent Labs  05/13/14 0500 05/14/14 0109 05/14/14 0500  WBC 14.9* 15.2*  --   HGB 7.6* 7.9*  --   HCT 24.1* 25.4*  --   NA 143  --  146  K 4.3  --  4.6  CL 108  --  110  CO2 23  --  24  BUN 96*  --  89*  CREATININE 3.10*  --  2.77*    Microbiology: resp cx = steno R bactrim S ceftaz S mino 10/19- Blood cx X2 NG final.  Studies/Results: Dg Chest Port 1 View  05/12/2014   CLINICAL DATA:  80 year old male with dyspnea.  EXAM: PORTABLE CHEST - 1  VIEW  COMPARISON:  Chest x-ray 05/08/2014.  FINDINGS: Tracheostomy tube in position with tip approximately 8.3 cm above the carina. There is a Left upper extremity PICC with tip terminating in the mid superior vena cava. Again noted are patchy areas of interstitial prominence and ill-defined airspace disease, throughout the mid to lower lungs bilaterally, most confluent in the right perihilar region extending to the periphery of the right lung, where there is some overlying pleural thickening between the right fourth and fifth ribs. Heart size and mediastinal contours are within normal limits. Atherosclerosis in the thoracic aorta.  IMPRESSION: 1. Support apparatus, as above. 2. Findings are concerning for multilobar bronchopneumonia, possibly with a tiny developing peripherally loculated pleural effusion in the lateral right hemithorax. 3.  Atherosclerosis.   Electronically Signed   By: Trudie Reedaniel  Entrikin M.D.   On: 05/12/2014 12:53    Assessment/Plan: Complicated uti = would finished total of 10 days for complicated uti. Currently on day 9 including treatment prior to admit. Finish 1 addn days of meropenem  Colonization with stenotrophomonas = chest xray appears more consistent with pulm edema. Consider holding off treatment of stenotrophomonas unless we see increased work of breathing or increase oxygenation needs/hypoxemia. Pt with same Vent settings, saturating 95-100%, noted 1 time copious secretions - tan to yellow on suctioning,otherwise has been small to moderate secretions. Linna CapriceSteno tends to easily develops multi drug resistance. At this stage, not completely convinced this is early hcap. Pt likely colonized with steno, will opt to not treat for now.  [name], [place] Pager: [phone] IMTS PGY-2 05/14/2014, 10:28 AM   -------------- i have personally examined and reviewed results. i agree with assessment and plan as discussed/described by dr. Mariea ClontsEmokpae.  Duke Salviaynthia B. Drue SecondSnider MD MPH [facility] [phone]

## 2014-05-14 NOTE — Progress Notes (Signed)
TEAM 1 - Stepdown/ICU TEAM Progress Note  Shawn Lamb NID:[id number] DOB: 1923-04-26 DOA: 05/06/2014 PCP: Verneita Griffes, MD  Admit HPI / Brief Narrative: 80 year old BM PMHx COPD, dementia, and diabetes. Resident of [place] facility secondary to chronic tracheostomy. He also has a chronic Foley catheter. He was sent to the emergency department because of altered mentation and apparent urinary retention. The nursing staff there had noticed decreased output and blood clots in the Foley catheter with associated altered mentation. This patient apparently has a history of recurrent UTI and was being treated for VRE UTI with ertapenem. According to the records from [place] he also has had progressive acute renal failure with severe azotemia.  Upon arrival to [facility] he was found to have a leukocytosis of 11,300. His creatinine had increased from a baseline of 1.6 to 4.8 and he was tachycardic. He also had azotemia with BUN of 117.  Since admission he has undergone IV fluid hydration with some improvement in his renal function although this has not returned to baseline. Infectious Disease Service has been consulted and suspects reason for altered mentation secondary to acute urinary retention and resultant renal failure but they do not suspect patient has acute UTI and his picture is more consistent with colonization. Patient's overall long-term prognosis is poor and Palliative Medicine met with the patient's daughters on 10/21. At that time family opted to continue with aggressive medical interventions.  After rehydration patient developed productive yellow-green sputum. Urinalysis positive for greater than 100,000 colonies of yeast, one-time dose of Diflucan 200 mg IV given. Renal function slowly improving with aggressive hydration.    HPI/Subjective: 10/27 alert and can answer yes and no questions with nods of his head. States feels SOB  Assessment/Plan: Sepsis secondary to  Funguria and HCAP  - pt currently on empiric abx tx  - sepsis criteria met  - WBC trending up this AM   - CXR 10/25 suggestive of multilobar bronchopneumia - follow ? developing effusion on R  - moderate stenotroph maltophilia on sputum cx continue meropenem -start fluconazole 200 mg 1 then 100 mg daily per PEG   Acute respiratory failure with hypoxia due to presumed HCAP  - this appears to be multifactorial in etiology and secondary to ? HCAP and imposed vascular congestion  - pt appears volume overloaded on exam, weight increased since admit but now stable at 244lbs - continue IV abx coverage  - stopped IVF to avoid further volume overload  - 2 D ECHO revealed preserved LV systolic fxn  -Patient with shortness of breath and wheezing Solu-Medrol 60 mgBID -Continue home dose of dexamethasone 4 mg daily -Mucomyst + Xopenex nebulizer treatment TID: to break up secretions  Respiratory acidosis uncompensated -See acute respiratory failure with hypoxia  Acute diastolic CHF  - was noted to be volume overloaded on 10/25  - given one dose of Lasix 20 mg IV at that time  - monitor daily weights admission weight= 99.3 kg  10/27 weight bed= 109.7 kg -Strict in and out since admission + 4.9 L -Hemoglobin goal> 8  Hypotension  - BPs appear to be stabilizing  - now off IVF   Acute on chronic renal failure  - unknown baseline as no previous records available  - pt with multiple risk factors for CKD including hx of DM, HTN, age, race, with GFR in 10-20's during this admission  - given the above, I suspect pt has at least CKD stage II - III  - Cr is trending  down since admission 4.5 --> 3.8 --> 3.0 --> 3.10 --> 2.77 this AM  - continue to monitor renal function closely   Anemia of chronic disease, likely from DM and CKD  - Hg slightly down over the past 24 hours: 8.2 > 7.8 > 7.6  - no signs of active bleeding  - repeat CBC in AM   Diabetes mellitus with complications of CKD  - Hg A1c 7.4   - continue Levemir 20 units qAm, 10 unitsqPm -Increase to moderate SSI   Severe PCM  - with underlying dysphagia  - continue tube feeding per PEG   Acute functional quadriplegia  - secondary to acute illnesses with complications outlined above  - attempt PT/OT once pt more medically stable   Acute encephalopathy  - resolving slowly   Tracheostomy dependent / COPD  - Currently on full ventilatory support but oxygen weaned to 30%   Dysphagia  - tube feeding per PEG   MRSA screen +      DVT prophylaxis: Subcutaneous heparin  Code Status: Full  Family Communication: No family at bedside  Disposition Plan/Expected LOS: Stepdown - eventual return to [facility] pending Palliative evaluation (?Tues or Wednesday)    Consultants: Dr. Billey Chang (palliative care) Dr. Carlyle Basques (infectious disease)  Procedure/Significant Events:    Culture 10/19 blood left arm/hand negative 10/19 urine positive yeast 10/20 MRSA by PCR positive 10/22 tracheal aspirate positive stenotrophomonas maltophilia 10/27 tracheal aspirate pending   Antibiotics: Ceftriaxone x1  Ertapenem x1  Imipenem 10/20 > stopped 10/25  Meropenem 10/26 > Fluconazole 10/27>>  DVT prophylaxis: Subcutaneous heparin   Devices Vent dependent   LINES / TUBES:      Continuous Infusions: . feeding supplement (NEPRO CARB STEADY) 1,000 mL (05/14/14 0025)    Objective: VITAL SIGNS: Temp: 97.4 F (36.3 C) (10/27 0900) Temp Source: Axillary (10/27 0900) BP: 113/76 mmHg (10/27 0900) Pulse Rate: 90 (10/27 0900) Vent Mode; PRVC  Set tidal volume; 550 Set rate; 16 FIO2: 30% PEEP; 5 Peak airway pressure; 33L/min Exhaled tidal I am; 635 ml   Intake/Output Summary (Last 24 hours) at 05/14/14 0956 Last data filed at 05/14/14 0900  Gross per 24 hour  Intake     50 ml  Output    675 ml  Net   -625 ml     Exam: General: Alert, can answer simple questions with nodding his head yes or no,  nods yes that he has some shortness of breath (tidal volumes ~550),  Lungs: Decreased air movement diffusely, positive wheezing greatest in bilateral apices, negative crackles. Thick yellow phlegm suctioned from trachea.  Cardiovascular: Regular rate and rhythm without murmur gallop or rub normal S1 and S2 Abdomen: Nontender, nondistended, soft, bowel sounds positive, no rebound, no ascites, no appreciable mass Extremities: No significant cyanosis, clubbing; edema bilateral lower extremities 2+    Data Reviewed: Basic Metabolic Panel:  Recent Labs Lab 05/09/14 0400 05/10/14 0650 05/11/14 1100 05/13/14 0500 05/14/14 0500  NA 144 143 143 143 146  K 4.1 4.5 4.3 4.3 4.6  CL 109 108 108 108 110  CO2 _0 GLUCOSE 72 194* 280* 301* 230*  BUN 103* 97* 97* 96* 89*  CREATININE 3.09* 3.05* 3.15* 3.10* 2.77*  CALCIUM 9.1 9.1 8.9 9.4 9.8   Liver Function Tests:  Recent Labs Lab 05/14/14 0500  AST 11  ALT 15  ALKPHOS 124*  BILITOT 0.2*  PROT 6.3  ALBUMIN 1.7*   No results found for this  basename: LIPASE, AMYLASE,  in the last 168 hours No results found for this basename: AMMONIA,  in the last 168 hours CBC:  Recent Labs Lab 05/09/14 0500 05/10/14 0650 05/11/14 1100 05/13/14 0500 05/14/14 0109  WBC 11.7* 14.7* 19.1* 14.9* 15.2*  NEUTROABS 9.2*  --   --   --   --   HGB 8.5* 8.2* 7.8* 7.6* 7.9*  HCT 25.9* 25.9* 24.9* 24.1* 25.4*  MCV 83.8 83.0 84.7 86.1 84.4  PLT 206 219 273 310 331   Cardiac Enzymes: No results found for this basename: CKTOTAL, CKMB, CKMBINDEX, TROPONINI,  in the last 168 hours BNP (last 3 results)  Recent Labs  05/07/14 0213 05/07/14 1317 05/12/14 1100  PROBNP 3433.0* 2701.0* 3477.0*   CBG:  Recent Labs Lab 05/13/14 1600 05/13/14 2016 05/14/14 0027 05/14/14 0452 05/14/14 0904  GLUCAP 158* 192* 205* 200* 197*    Recent Results (from the past 240 hour(s))  URINE CULTURE     Status: None   Collection Time    05/06/14  7:04 PM       Result Value Ref Range Status   Specimen Description URINE, CLEAN CATCH   Final   Special Requests NONE   Final   Culture  Setup Time     Final   Value: 05/06/2014 20:38     Performed at [facility]     Final   Value: >=100,000 COLONIES/ML     Performed at [facility]     Final   Value: YEAST     Performed at [facility]   Report Status 05/08/2014 FINAL   Final  CULTURE, BLOOD (ROUTINE X 2)     Status: None   Collection Time    05/06/14  8:52 PM      Result Value Ref Range Status   Specimen Description BLOOD ARM LEFT   Final   Special Requests BOTTLES DRAWN AEROBIC AND ANAEROBIC 10CC   Final   Culture  Setup Time     Final   Value: 05/07/2014 01:18     Performed at [place]     Final   Value: NO GROWTH 5 DAYS     Performed at [facility]   Report Status 05/13/2014 FINAL   Final  CULTURE, BLOOD (ROUTINE X 2)     Status: None   Collection Time    05/06/14  9:00 PM      Result Value Ref Range Status   Specimen Description BLOOD HAND LEFT   Final   Special Requests BOTTLES DRAWN AEROBIC ONLY 8CC   Final   Culture  Setup Time     Final   Value: 05/07/2014 01:18     Performed at [facility]   Culture     Final   Value: NO GROWTH 5 DAYS     Performed at [facility]   Report Status 05/13/2014 FINAL   Final  MRSA PCR SCREENING     Status: Abnormal   Collection Time    05/07/14 12:35 PM      Result Value Ref Range Status   MRSA by PCR POSITIVE (*) NEGATIVE Final   Comment:            The GeneXpert MRSA Assay (FDA     approved for NASAL specimens     only), is one component of a     comprehensive MRSA colonization  surveillance program. It is not     intended to diagnose MRSA     infection nor to guide or     monitor treatment for     MRSA infections.     RESULT CALLED TO, READ BACK BY AND VERIFIED WITH:     WELLINGTON RN 14:05 05/07/14 ([email or web address])  CULTURE,  RESPIRATORY (NON-EXPECTORATED)     Status: None   Collection Time    05/09/14  9:15 AM      Result Value Ref Range Status   Specimen Description TRACHEAL ASPIRATE   Final   Special Requests NONE   Final   Gram Stain     Final   Value: FEW WBC PRESENT, PREDOMINANTLY PMN     NO SQUAMOUS EPITHELIAL CELLS SEEN     RARE GRAM NEGATIVE RODS     Performed at [facility]   Culture     Final   Value: MODERATE STENOTROPHOMONAS MALTOPHILIA     Performed at [facility]   Report Status 05/13/2014 FINAL   Final   Organism ID, Bacteria STENOTROPHOMONAS MALTOPHILIA   Final     Studies:  Recent x-ray studies have been reviewed in detail by the Attending Physician  Scheduled Meds:  Scheduled Meds: . antiseptic oral rinse  7 mL Mouth Rinse QID  . chlorhexidine  15 mL Mouth/Throat BID  . dexamethasone  4 mg Per Tube Daily  . free water  250 mL Per Tube Q6H  . heparin subcutaneous  5,000 Units Subcutaneous 3 times per day  . insulin aspart  0-9 Units Subcutaneous 6 times per day  . insulin detemir  10 Units Subcutaneous QHS  . insulin detemir  20 Units Subcutaneous Daily  . levalbuterol  1.25 mg Nebulization TID  . meropenem (MERREM) IV  500 mg Intravenous Q12H  . multivitamin with minerals  1 tablet Per Tube Daily  . pantoprazole (PROTONIX) IV  40 mg Intravenous Q12H  . senna  1 tablet Per Tube Daily  . sodium chloride  10-40 mL Intracatheter Q12H  . sodium chloride  3 mL Intravenous Q12H  . vitamin C  500 mg Per Tube Daily    Time spent on care of this patient: 40 mins   Allie Bossier , MD   [facility] Office  [phone] Pager - [phone]  On-Call/Text Page:      [email or web address]      password [email or web address]  If 7PM-7AM, please contact night-coverage [email or web address] Password [email or web address] 05/14/2014, 9:56 AM   LOS: 8 days

## 2014-05-14 NOTE — Progress Notes (Addendum)
Patient ZO:[id number]:Shawn Lamb      DOB: 1922-10-06      JWJ:[id number]   Palliative Medicine Team at [facility] Progress Note    Subjective: Patient awakens easily to tactile and verbal stimuli. He relates that he is in no pain or distress.  Daughter Shawn Lamb is at the bedside. Shawn Lamb when home to [place] but will be back tonight.  Shawn Lamb tells me "We are not giving up or in." This is without inquiring of their plans.  Comforted Shawn Lamb regarding how hard this is but did not go further.  Family has stated goals clearly even if they are not realistic goals.  Patient is not suffering at least outwardly. Concur that he will need LTACh until other arrangements can be made.     Filed Vitals:   05/14/14 1251  BP: 114/48  Pulse: 104  Temp: 97.2 F (36.2 C)  Resp: 19   Physical exam:  General: no distress on vent PERRL, mmm Chest decreased but clear anteriorly CVS: tachy, s1, S2 Abd: soft obese not tender or distended, no grimace on palpation Ext: warm, no mottling Neuro: able to mouth responses to questions seems to understand and respond appropriately.    Assessment and plan: 80 yr old African [place] male vent and trach dependent.  Family continue to assert curative care and full code status . Desire continued rehab.  PMT can not further help family at this time other than to be supportive during time of crisis .   Will sign off . Please reconsult as needed.    Evalie Hargraves L. Ladona Ridgelaylor, MD MBA The Palliative Medicine Team at [facility] Team Phone: [phone] Pager: [phone] ( Use team phone after hours)

## 2014-05-15 DIAGNOSIS — Z515 Encounter for palliative care: Secondary | ICD-10-CM

## 2014-05-15 LAB — CBC WITH DIFFERENTIAL/PLATELET
BASOS PCT: 0 % (ref 0–1)
Basophils Absolute: 0 10*3/uL (ref 0.0–0.1)
EOS ABS: 0 10*3/uL (ref 0.0–0.7)
EOS PCT: 0 % (ref 0–5)
HCT: 27.3 % — ABNORMAL LOW (ref 39.0–52.0)
Hemoglobin: 8.4 g/dL — ABNORMAL LOW (ref 13.0–17.0)
Lymphocytes Relative: 3 % — ABNORMAL LOW (ref 12–46)
Lymphs Abs: 0.5 10*3/uL — ABNORMAL LOW (ref 0.7–4.0)
MCH: 26.7 pg (ref 26.0–34.0)
MCHC: 30.8 g/dL (ref 30.0–36.0)
MCV: 86.7 fL (ref 78.0–100.0)
Monocytes Absolute: 0.2 10*3/uL (ref 0.1–1.0)
Monocytes Relative: 1 % — ABNORMAL LOW (ref 3–12)
NEUTROS PCT: 96 % — AB (ref 43–77)
Neutro Abs: 16.9 10*3/uL — ABNORMAL HIGH (ref 1.7–7.7)
Platelets: 364 10*3/uL (ref 150–400)
RBC: 3.15 MIL/uL — AB (ref 4.22–5.81)
RDW: 16.9 % — AB (ref 11.5–15.5)
WBC: 17.6 10*3/uL — ABNORMAL HIGH (ref 4.0–10.5)

## 2014-05-15 LAB — COMPREHENSIVE METABOLIC PANEL
ALT: 13 U/L (ref 0–53)
AST: 9 U/L (ref 0–37)
Albumin: 1.8 g/dL — ABNORMAL LOW (ref 3.5–5.2)
Alkaline Phosphatase: 114 U/L (ref 39–117)
Anion gap: 9 (ref 5–15)
BUN: 86 mg/dL — ABNORMAL HIGH (ref 6–23)
CALCIUM: 9.9 mg/dL (ref 8.4–10.5)
CO2: 26 mEq/L (ref 19–32)
Chloride: 111 mEq/L (ref 96–112)
Creatinine, Ser: 2.37 mg/dL — ABNORMAL HIGH (ref 0.50–1.35)
GFR calc non Af Amer: 23 mL/min — ABNORMAL LOW (ref >90)
GFR, EST AFRICAN AMERICAN: 26 mL/min — AB (ref >90)
Glucose, Bld: 182 mg/dL — ABNORMAL HIGH (ref 70–99)
POTASSIUM: 4.6 meq/L (ref 3.7–5.3)
SODIUM: 146 meq/L (ref 137–147)
TOTAL PROTEIN: 6.6 g/dL (ref 6.0–8.3)
Total Bilirubin: 0.2 mg/dL — ABNORMAL LOW (ref 0.3–1.2)

## 2014-05-15 LAB — GLUCOSE, CAPILLARY
Glucose-Capillary: 152 mg/dL — ABNORMAL HIGH (ref 70–99)
Glucose-Capillary: 166 mg/dL — ABNORMAL HIGH (ref 70–99)
Glucose-Capillary: 225 mg/dL — ABNORMAL HIGH (ref 70–99)
Glucose-Capillary: 247 mg/dL — ABNORMAL HIGH (ref 70–99)
Glucose-Capillary: 260 mg/dL — ABNORMAL HIGH (ref 70–99)
Glucose-Capillary: 269 mg/dL — ABNORMAL HIGH (ref 70–99)

## 2014-05-15 LAB — BLOOD GAS, ARTERIAL
ACID-BASE EXCESS: 0.5 mmol/L (ref 0.0–2.0)
BICARBONATE: 25.4 meq/L — AB (ref 20.0–24.0)
Drawn by: 28701
FIO2: 0.3 %
LHR: 16 {breaths}/min
MECHVT: 550 mL
O2 SAT: 76.5 %
PATIENT TEMPERATURE: 98.6
PEEP/CPAP: 5 cmH2O
PO2 ART: 42.4 mmHg — AB (ref 80.0–100.0)
TCO2: 26.8 mmol/L (ref 0–100)
pCO2 arterial: 47 mmHg — ABNORMAL HIGH (ref 35.0–45.0)
pH, Arterial: 7.352 (ref 7.350–7.450)

## 2014-05-15 LAB — MAGNESIUM: Magnesium: 1.8 mg/dL (ref 1.5–2.5)

## 2014-05-15 MED ORDER — FREE WATER: 250.0000 mL | Freq: Four times a day (QID) | Status: AC

## 2014-05-15 MED ORDER — LEVALBUTEROL HCL 1.25 MG/0.5ML IN NEBU: 1.2500 mg | INHALATION_SOLUTION | Freq: Three times a day (TID) | RESPIRATORY_TRACT | Status: DC

## 2014-05-15 MED ORDER — SODIUM CHLORIDE 0.9 % IV SOLN: 500.0000 mg | Freq: Two times a day (BID) | INTRAVENOUS | Status: AC

## 2014-05-15 MED ORDER — ACETYLCYSTEINE 20 % IN SOLN: 3.0000 mL | Freq: Three times a day (TID) | RESPIRATORY_TRACT | Status: AC

## 2014-05-15 MED ORDER — INSULIN DETEMIR 100 UNIT/ML ~~LOC~~ SOLN: 10.0000 [IU] | Freq: Every day | SUBCUTANEOUS | Status: AC

## 2014-05-15 MED ORDER — FLUCONAZOLE 40 MG/ML PO SUSR: 100.0000 mg | Freq: Every day | ORAL | Status: AC

## 2014-05-15 MED ORDER — SODIUM CHLORIDE 0.9 % IJ SOLN: 10.0000 mL | Freq: Two times a day (BID) | INTRAMUSCULAR | Status: AC

## 2014-05-15 MED ORDER — NEPRO/CARBSTEADY PO LIQD: 1000.0000 mL | ORAL | Status: AC

## 2014-05-15 MED ORDER — CETYLPYRIDINIUM CHLORIDE 0.05 % MT LIQD: 7.0000 mL | Freq: Four times a day (QID) | OROMUCOSAL | Status: AC

## 2014-05-15 MED ORDER — INSULIN DETEMIR 100 UNIT/ML ~~LOC~~ SOLN: 20.0000 [IU] | Freq: Every day | SUBCUTANEOUS | Status: AC

## 2014-05-15 MED ORDER — DIPHENHYDRAMINE-ZINC ACETATE 2-0.1 % EX CREA: TOPICAL_CREAM | Freq: Three times a day (TID) | CUTANEOUS | Status: AC | PRN

## 2014-05-15 MED ORDER — SODIUM CHLORIDE 0.9 % IV SOLN: 500.0000 mg | Freq: Two times a day (BID) | INTRAVENOUS | Status: DC

## 2014-05-15 MED ORDER — SODIUM CHLORIDE 0.9 % IJ SOLN
3.0000 mL | Freq: Two times a day (BID) | INTRAMUSCULAR | Status: AC
Start: 2014-05-15 — End: ?

## 2014-05-15 MED ORDER — HEPARIN SODIUM (PORCINE) 5000 UNIT/ML IJ SOLN: 5000.0000 [IU] | Freq: Three times a day (TID) | INTRAMUSCULAR | Status: AC

## 2014-05-15 MED ORDER — LEVALBUTEROL HCL 1.25 MG/0.5ML IN NEBU: 1.2500 mg | INHALATION_SOLUTION | RESPIRATORY_TRACT | Status: AC | PRN

## 2014-05-15 MED ORDER — ACETAMINOPHEN 325 MG PO TABS: 650.0000 mg | ORAL_TABLET | Freq: Four times a day (QID) | ORAL | Status: AC | PRN

## 2014-05-15 MED ORDER — SODIUM CHLORIDE 0.9 % IJ SOLN: 10.0000 mL | INTRAMUSCULAR | Status: AC | PRN

## 2014-05-15 NOTE — Progress Notes (Signed)
[facility]    Date of Admission:  05/06/2014   Total days of antibiotics 9        Day 8 carbapenem          ID: Shawn Lamb is a 80 y.o. male with LTACH resident colonized with MDRO presented  With sepsis from presumed urinary source. Outside cx showed MDR ecoli and amp S enterococcus. Repeat blood cx negative. Repeat urine cx yeast, and respiratory cx showing stenotrophomonas.   Principal Problem:   Sepsis secondary to UTI Active Problems:   UTI (lower urinary tract infection)   COPD (chronic obstructive pulmonary disease)   Anemia   Tracheostomy dependent   Diabetes mellitus without complication   CHF (congestive heart failure)   Acute encephalopathy  Subjective: Most likely [facility] to [facility] today. Daughters planning to take patient to [place]. Noted secretions in Vent, but saturating 95-100% on present vent setting, unchanged. Sleepy, opens eyes to voice, obeys some commands.    Medications:  . acetylcysteine  3 mL Nebulization TID  . antiseptic oral rinse  7 mL Mouth Rinse QID  . chlorhexidine  15 mL Mouth/Throat BID  . dexamethasone  4 mg Per Tube Daily  . fluconazole  100 mg Per Tube Daily  . free water  250 mL Per Tube Q6H  . heparin subcutaneous  5,000 Units Subcutaneous 3 times per day  . insulin aspart  0-15 Units Subcutaneous 6 times per day  . insulin detemir  10 Units Subcutaneous QHS  . insulin detemir  20 Units Subcutaneous Daily  . levalbuterol  1.25 mg Nebulization TID  . meropenem (MERREM) IV  500 mg Intravenous Q12H  . multivitamin with minerals  1 tablet Per Tube Daily  . pantoprazole (PROTONIX) IV  40 mg Intravenous Q12H  . senna  1 tablet Per Tube Daily  . sodium chloride  10-40 mL Intracatheter Q12H  . sodium chloride  3 mL Intravenous Q12H  . vitamin C  500 mg Per Tube Daily    Objective: Vital signs in last 24 hours: Temp:  [97.8 F (36.6 C)-98.6 F (37 C)] 98.1 F (36.7 C) (10/28 1217) Pulse Rate:   [90-107] 96 (10/28 1217) Resp:  [14-26] 19 (10/28 1217) BP: (119-185)/(58-151) 185/108 mmHg (10/28 1143) SpO2:  [97 %-100 %] 100 % (10/28 1217) FiO2 (%):  [30 %-45 %] 30 % (10/28 1143) Weight:  [244 lb (110.678 kg)] 244 lb (110.678 kg) (10/28 0420)  Gen = Awake, responds to some commands. Neck = trach in place Cardiac= nl s2,s2 Pulm= no added sounds Abd= decrease BS Extremities= moves all extremities voluntarily, appear puffy  Lab Results  Recent Labs  05/14/14 0109 05/14/14 0500 05/15/14 0500  WBC 15.2*  --  17.6*  HGB 7.9*  --  8.4*  HCT 25.4*  --  27.3*  NA  --  146 146  K  --  4.6 4.6  CL  --  110 111  CO2  --  24 26  BUN  --  89* 86*  CREATININE  --  2.77* 2.37*    Microbiology: resp cx = steno R bactrim S ceftaz S mino 10/19- Blood cx X2 NG final. 10/27- tracheal aspirate- Gram neg rods >>  Studies/Results: Dg Chest Port 1 View  05/14/2014   CLINICAL DATA:  Pneumonia  EXAM: PORTABLE CHEST - 1 VIEW  COMPARISON:  05/12/2014  FINDINGS: Tracheostomy tube in satisfactory position. No significant pleural effusion or pneumothorax. Bilateral perihilar interstitial thickening, left greater  than right. The right perihilar airspace opacity demonstrates interval resolution. Stable cardiomediastinal silhouette.  The osseous structures are unremarkable.  IMPRESSION: 1. Bilateral interstitial thickening, left greater than right, which may reflect mild interstitial edema versus chronic interstitial lung disease.   Electronically Signed   By: Elige KoHetal  Patel   On: 05/14/2014 17:20    Assessment/Plan: Complicated uti = would finished total of 10 days for complicated uti. Currently on day 10 including treatment prior to admit. Stop after todays dose.  Colonization with stenotrophomonas = chest xray appears more consistent with pulm edema, though WBc trending up. If transferred to health care facility today, Should be monitored for decompensating or increasing O2 requirements, work of  breathing or hypoxemia. Pt with same Vent settings, saturating 95-100%, noted 1 time copious secretions - tan to yellow on suctioning,otherwise has been small to moderate secretions. Shawn Lamb tends to easily develops multi drug resistance. Pt likely colonized with steno, will opt to not treat for now. Consider repeat chest xray tomorrow if still here.  Shawn Lamb, Shawn Lamb Pager: [phone] IMTS PGY-2 05/15/2014, 1:28 PM

## 2014-05-15 NOTE — Clinical Social Work Note (Addendum)
2:30 CSW spoke with Eunice Blaseebbie at [facility].  Eunice BlaseDebbie stated that she is unable to find patients medicaid application at this time but that she does anticipate patients [place] medicaid will get approved.  Debbie stated that she will call the POA, Corrie DandyMary, and inform that patient should go back [facility].  12:00pm CSW spoke with Selena BattenKim and Eunice Blaseebbie at [facility].  [place] and that patient would need to [facility].  Debbie stated that [place] does not have emergency Medicaid so she is unsure what it is that the family applied for.  Eunice BlaseDebbie is looking into whether or not family applied for Medicaid.  CSW awaiting confirmation.  Patients daughter spoke with CSW about how much it would mean for [place].  Patients daughter believes that patient is homesick and that returning home would help his recovery.  Patients daughter also stated that patients POA, Corrie DandyMary, would be arriving between 12:30-3pm.  11:00am CSW spoke with patients daughter, Clayborne Danaatti, at bedside.  Patients daughter stated that emergency Medicaid was applied for yesterday (10/27) vs Friday as CSW was told yesterday.  CSW reiterated that patient is unable to remain in the hospital while patient no longer meets criteria and patient will need to transfer to [facility] while family awaits insurance approval.    Patients daughter state that [facility] was unwilling to transfer [place] and was told transition would be easiest at the hospital- CSW inquired and was informed that [facility] will not pay for transportation to alternative Vent SNF and that would be the financial responsibility of the patients family.  CSW attempted to call patients POA, Corrie DandyMary, but was unable to reach- left message.  Patient is ready to DC today.  CSW, RN Sports coachCase Manager, and doctor have all informed family on  muliple occasions that patient is unable to wait here [place] and have also clarified on several occasions that [facility] a [facility] facility and the patient is out of days.  Patients daughter stated that they have been speaking with Enrique SackKendra at [facility] had informed them that the [facility] if they receive emergency Medicaid.  [facility] [phone]) to clarify. Left Message.  CSW will continue to follow.  Merlyn LotJenna Holoman, LCSWA Clinical Social Worker [phone]

## 2014-05-15 NOTE — Progress Notes (Signed)
Inpatient Diabetes Program Recommendations  AACE/ADA: New Consensus Statement on Inpatient Glycemic Control (2013)  Target Ranges:  Prepandial:   less than 140 mg/dL      Peak postprandial:   less than 180 mg/dL (1-2 hours)      Critically ill patients:  140 - 180 mg/dL   Results for Shawn Lamb, Shawn Lamb (MRN [id number]) as of 05/15/2014 09:12  Ref. Range 05/14/2014 00:27 05/14/2014 04:52 05/14/2014 09:04 05/14/2014 10:48 05/14/2014 12:54 05/14/2014 16:01 05/14/2014 21:35 05/15/2014 00:25 05/15/2014 04:22 05/15/2014 07:42  Glucose-Capillary Latest Range: 70-99 mg/dL 409205 (H) 811200 (H) 914197 (H) 184 (H) 238 (H) 212 (H) 242 (H) 260 (H) 152 (H) 166 (H)   Diabetes history: DM2  Outpatient Diabetes medications: Levemir 30 units daily, Levemir 10 units QHS, Novolog 5-20 units Q6H  Current orders for Inpatient glycemic control: Levemir 20 units QAM, Levemir 10 units QPM, Novolog 0-15 units Q4H  Inpatient Diabetes Program Recommendations Insulin - Basal: Please increase am Levemir to 22 units QAM and evening dose to Levemir 12 units QPM. Insulin - Tube feeding coverage: Please consider ordering Novolog 2 units Q4H for tube feeding coverage.  Note: CBGs ranged from 184-242 mg/dl on 78/2910/27 and patient received a total of Novolog 15 units for correction. CBGs have ranged from 152-260 mg/dl so far today and patient has already received a total of Novolog 11 units for correction. Noted Solumedrol 60 mg Q12H was started yesterday at 10:17 am. Please increase am Levemir to 22 units QAM, evening dose of Levemir to 12 units QPM and order Novolog 2 units Q4H for tube feeding coverage.  Thanks, Orlando PennerMarie Azarya Oconnell, RN, MSN, CCRN Diabetes Coordinator Inpatient Diabetes Program [phone] (Team Pager) [phone] (AP office) [phone] [facility] office)

## 2014-05-15 NOTE — Discharge Summary (Addendum)
Physician Discharge Summary  Shawn Lamb POE:[id number] DOB: [birth year] DOA: 05/06/2014  PCP: Verneita Griffes, MD  Admit date: 05/06/2014 Discharge date: 05/15/2014  Time spent: > 30 minutes  Recommendations for Outpatient Follow-up:  1. Follow BMET every 3 days re: recent acute renal failure 2. [facility] 3. Continue PT/OT as well as SLP for reconditioning, PMV training and ventilator weaning 4. Recommend repeat chest x-ray on 11/2 follow possible evolving right pleural effusion  Discharge Diagnoses:    Sepsis secondary to UTI (preadmit cx MDR E Coli and Enterococcus) - to complete 10 days antibitotics on 10/29   Funguria   Respiratory colonization by stenotrophomonas    Acute renal failure with hypernatremia on stage 3 CKD   COPD    Anemia   Ventilator and Tracheostomy dependent   Diabetes mellitus without complication   Acute diastolic heart failure exacerbation in setting of fluid resuscitation   Acute encephalopathy   Severe protein calorie malnutrition/albumin 1.9  Discharge Condition: stable  Diet recommendation: Tube feeding: Nepro carb steady at 50 cc/hr  Filed Weights   05/13/14 0400 05/14/14 0500 05/15/14 0420  Weight: 244 lb (110.678 kg) 242 lb (109.77 kg) 244 lb (110.678 kg)    History of present illness:  80 year old M w/ Hx COPD, dementia, and diabetes. Resident of [facility] facility secondary to chronic tracheostomy. He also has a chronic Foley catheter. He was sent to the emergency department because of altered mentation and apparent urinary retention. The nursing staff there had noticed decreased output and blood clots in the Foley catheter with associated altered mentation. The patient has a history of recurrent UTI and was being treated for VRE UTI with ertapenem. According to the records from [place] he also has had progressive acute renal failure with severe azotemia.   Upon arrival to [facility] he was found to  have a leukocytosis of 11,300. His creatinine had increased from a baseline of 1.6 to 4.8 and he was tachycardic. He also had azotemia with BUN of 117.   Hospital course: Since admission he has undergone IV fluid resuscitation with some improvement in his renal function although this has not yet returned to baseline. Infectious Disease Service was consulted and suspected reason for altered mentation was secondary to acute urinary retention and resultant renal failure.  ID did not suspect patient had an actual acute UTI and his picture was more consistent with colonization.  Patient's overall long-term prognosis is poor and Palliative Medicine met with the patient's daughters on 10/21. At that time family opted to continue with aggressive medical interventions. Palliative Medicine reconnected with the daughters after that initial conversation but at this time they are not open to discussing end-of-life issues wishing to continue to pursue aggressive medical therapies.  Sepsis secondary to Funguria / complicated UTI - possible VAP -Evaluated by Infectious Disease service upon admission - who recommended completing 10 full days of abx tx for a complicated UTI -Preadmission cultures positive for multidrug-resistant enterococcus  -Blood cultures obtained at this facility were negative. -Urinalysis positive for greater than 100,000 colonies of yeast > tx w/ Diflucan. - CXR on 10/21 documented stable perihilar opacity in the right lung. Pulmonary cultures were obtained and this demonstrated moderate stenotrophomonas maltophilia likely a colonizer  - Follow-up chest x-ray 10/25 was suggestive of multilobar bronchopneumia - and possible tiny developing effusion on R  -follow-up chest x-ray on 10/27 consistent with chronic interstitial lung disease. -Wbc's had been trending upward with last reading 17,000 but this  in setting of patient receiving both Decadron and Solu-Medrol. Solu-Medrol was discontinued on  10/28.  Acute respiratory failure with hypoxia due to presumed VAP and episodic acute diastolic heart failure - Etiology appeared to be multifactorial i.e. secondary to possible VAP with imposed vascular congestion which worsened after IV fluid hydration for acute renal failure -Patient's weight had increased since admit but now stable at 244lbs noting IV fluids were discontinued; he was given a one-time dose of Lasix IV - 2 D ECHO obtained on 10/25 revealed preserved LV systolic fxn  -Patient noted with occasional clinical findings consistent with bucking the ventilator which has impaired additional weaning from the ventilator  Chronic ventilator dependence/COPD -During this hospitalization the patient has been unable to make significant headway in weaning from the ventilator. Noting he has at least been on the ventilator since August 2015 -Long-term prognosis appears poor and therefore palliative medicine evaluation was undertaken this admission. -Patient's daughters are adamant about continuing aggressive care for their father. Last encounter with palliative medicine was on 10/27 at which point one of the daughters told the palliative physicians "we are not giving up or in". The patient did not appear to be suffering outwardly. -In regards to aggressive rehabilitation it was discovered that the patient has run out of LTAC days and therefore will need to return to the ventilator skilled nursing facility. An attempt was made to transfer patient to [facility] ventilator LTAC during this admission but as previously documented the patient is out of LTAC days and this facility does not have skilled nursing facility beds. There was some discussion that if the patient could receive emergency Medicaid he might once again become eligible for an LTAC facility but at time of dictation of discharge summary we have not received word back from that facility regarding this option. The social worker once again called  the [facility] admissions department on 10/28 to clarify this option.  Hypotension  - Recurrent issue since admission noting difficulty obtaining accurate blood pressures given marked bilateral upper extremity edema, PICC line in left upper extremity, and inconsistent blood pressure readings in the lower extremities - Initially was hydrated with IV fluids (see below) with inconsistent improvement in blood pressure readings -Flomax was discontinued -Random serum cortisol was 7-if hypotension recurs consider a cosyntropin stimulation test -Blood pressure readings remain stable at time of d/c   Acute on chronic renal failure stage III - unknown baseline as no previous records available and patient not acutely decompensated  - pt with multiple risk factors for CKD including hx of DM, HTN, age, race, with GFR in 10-20's during this admission  - Cr is trending down since admission 4.5 --> 3.8 --> 3.0 --> 3.10 --> 2.37 -BUN remains elevated at 86 and likely someone influenced by recent administration of Solu-Medrol as well as chronic use of Decadron - continue to monitor renal function closely   Anemia of chronic disease, likely from DM and CKD  - Hgb has remained in the 7-8 range since admission noting on date of discharge hemoglobin 8.4 with an MCV of 87 and RDW of 16.9 - no signs of active bleeding  - repeat CBC in a week  Diabetes mellitus with complications of CKD  - Hg A1c 7.4  - continue Levemir 20 units qAm, 10 unitsqPm  - continue SSI   Severe PCM /dysphagia - Chronic dysphagia preadmission in setting of chronic ventilator dependence - continue tube feeding per PEG  - Albumin 1.8 on 10/28  Profound  physical deconditioning  - secondary to acute illnesses with complications outlined above  - attempt resumed PT/OT once returns to skilled nursing facility  Chronic rash -presented with erythematous diffuse rash over entire body -Blanches easily without any raised borders -Has not  worsened nor improved since admission and does not appear to be consistent with an allergic reaction -If rash persists once patient is off all antibiotics and antifungal agents may need formal dermatology consultation for biopsy to determine etiology of rash -Note patient recently on IV steroids for respiratory symptoms noting no improvement in rash while on steroids  Acute encephalopathy  - resolving slowly   Tracheostomy dependent / COPD  - Currently on full ventilatory support but oxygen weaned to 30%   MRSA screen +   Procedures: 2-D echocardiogram: Procedure narrative: The patient was unable to respond to pain level query due to mechanical ventilation. Transthoracic echocardiography. Image quality was poor. The study was technically difficult, as a result of poor patient compliance. - Left ventricle: The cavity size was normal. Wall thickness was increased in a pattern of mild LVH. Systolic function was normal. The estimated ejection fraction was in the range of 55% to 60%. Images were inadequate for LV wall motion assessment. - Aortic valve: Mildly calcified annulus. Mildly thickened leaflets. - Mitral valve: Mildly thickened leaflets . - Right ventricle: Systolic function was mildly reduced.  Consultations: Palliative Care Infectious Disease  Discharge Exam: Blood pressure 185/108, pulse 96, temperature 98.1 F (36.7 C), temperature source Oral, resp. rate 19, height $RemoveBe'5\' 11"'BkgGQorPh$  (1.803 m), weight 244 lb (110.678 kg), SpO2 100.00%.  General: Awake and in no respiratory distress Lungs: Bilateral lung sounds equal without any wheezing, only a few basilar crackles. Ventilator settings: PRVC mode, VT 550, rate 16, PEEP 5, FiO2 30% Cardiovascular: Regular rate and rhythm without murmur gallop or rub  Abdomen: Nontender, nondistended, soft, bowel sounds positive, no rebound, no ascites, no appreciable mass - PEG insertion is clean and dry  Extremities: No significant cyanosis,  clubbing; no edema below knees - no significant edema in bilateral upper extremities, some bilateral thigh and buttock edema, mild scrotal edema.  Discharge Instructions   Diet general    Complete by:  As directed   TUBE FEEDINGS (SEE MAR)     Increase activity slowly    Complete by:  As directed   PT/OT as tolerates          Current Discharge Medication List    START taking these medications   Details  acetaminophen (TYLENOL) 325 MG tablet Place 2 tablets (650 mg total) into feeding tube every 6 (six) hours as needed for mild pain (or Fever >/= 101).    acetylcysteine (MUCOMYST) 20 % nebulizer solution Take 3 mLs by nebulization 3 (three) times daily. Qty: 30 mL, Refills: 12    antiseptic oral rinse (CPC / CETYLPYRIDINIUM CHLORIDE 0.05%) 0.05 % LIQD solution 7 mLs by Mouth Rinse route QID. Refills: 0    diphenhydrAMINE-zinc acetate (BENADRYL) cream Apply topically 3 (three) times daily as needed for itching. Qty: 28.4 g, Refills: 0    fluconazole (DIFLUCAN) 40 MG/ML suspension Place 2.5 mLs (100 mg total) into feeding tube daily. Qty: 35 mL, Refills: 0    heparin 5000 UNIT/ML injection Inject 1 mL (5,000 Units total) into the skin every 8 (eight) hours. Qty: 1 mL    !! levalbuterol (XOPENEX) 1.25 MG/0.5ML nebulizer solution Take 1.25 mg by nebulization every 4 (four) hours as needed for wheezing or shortness of breath. Qty:  1 each, Refills: 12    !! levalbuterol (XOPENEX) 1.25 MG/0.5ML nebulizer solution Take 1.25 mg by nebulization 3 (three) times daily. Qty: 1 each, Refills: 12    meropenem 500 mg in sodium chloride 0.9 % 50 mL Inject 500 mg into the vein every 12 (twelve) hours.    !! sodium chloride 0.9 % injection 10-40 mLs by Intracatheter route every 12 (twelve) hours. Qty: 5 mL, Refills: 0    !! sodium chloride 0.9 % injection 10-40 mLs by Intracatheter route as needed (flush). Qty: 5 mL, Refills: 0    !! sodium chloride 0.9 % injection Inject 3 mLs into the  vein every 12 (twelve) hours. Qty: 5 mL, Refills: 0    Water For Irrigation, Sterile (FREE WATER) SOLN Place 250 mLs into feeding tube every 6 (six) hours.     !! - Potential duplicate medications found. Please discuss with provider.    CONTINUE these medications which have CHANGED   Details  !! insulin detemir (LEVEMIR) 100 UNIT/ML injection Inject 0.1 mLs (10 Units total) into the skin at bedtime. Qty: 10 mL, Refills: 11    !! insulin detemir (LEVEMIR) 100 UNIT/ML injection Inject 0.2 mLs (20 Units total) into the skin daily. Qty: 10 mL, Refills: 11    Nutritional Supplements (FEEDING SUPPLEMENT, NEPRO CARB STEADY,) LIQD Place 1,000 mLs into feeding tube continuous. Refills: 0     !! - Potential duplicate medications found. Please discuss with provider.    CONTINUE these medications which have NOT CHANGED   Details  Carboxymethylcellulose Sodium (REFRESH TEARS OP) Place 1 drop into both eyes every 6 (six) hours as needed (dry eyes).    chlorhexidine (PERIDEX) 0.12 % solution Use as directed 15 mLs in the mouth or throat 2 (two) times daily.    dexamethasone (DECADRON) 4 MG tablet 4 mg by PEG Tube route daily.    diphenhydrAMINE (BENADRYL) 12.5 MG/5ML liquid Take 25 mg by mouth every 6 (six) hours as needed (reaction).    docusate (COLACE) 50 MG/5ML liquid 100 mg by PEG Tube route 2 (two) times daily.    famotidine (PEPCID) 20 MG tablet 20 mg by PEG Tube route 2 (two) times daily.    insulin aspart (NOVOLOG) 100 UNIT/ML injection Inject 5-20 Units into the skin every 6 (six) hours as needed for high blood sugar. 200-300=5 units 301-400=10 units 401-500=15 units If greater than 500 give 20 units and call MD.    Multiple Vitamins-Minerals (MULTIVITAMIN WITH MINERALS) tablet 1 tablet by PEG Tube route daily.    NON FORMULARY Enteral feed. If residuals are greater than hold feeding. Elevate head of bed 30-45 degrees during feeding and 30-60 mins after.    senna  (SENOKOT) 8.6 MG tablet 1 tablet by PEG Tube route daily.    vitamin C (ASCORBIC ACID) 500 MG tablet 500 mg by PEG Tube route daily.      STOP taking these medications     ERTAPENEM SODIUM IV      ipratropium-albuterol (DUONEB) 0.5-2.5 (3) MG/3ML SOLN      sodium chloride 0.45 %      Sodium Chloride Flush (SALINE FLUSH IV)      tamsulosin (FLOMAX) 0.4 MG CAPS capsule        Allergies  Allergen Reactions  . Sulfa Antibiotics Other (See Comments)    unknown  . Vancomycin Other (See Comments)    unknown  . Vioxx [Rofecoxib] Other (See Comments)    unknown  . Zyvox [Linezolid] Other (See  Comments)    unknown    Significant Diagnostic Studies: US Renal  05/07/2014   CLINICAL DATA:  Acute kidney disease. Hematuria. Urinary retention.  EXAM: RENAL/URINARY TRACT ULTRASOUND COMPLETE  COMPARISON:  None.  FINDINGS: Right Kidney:  Length: 10.3 cm. Thinning of renal parenchyma with diffuse increased echotexture consistent with atrophy and medical renal disease. No hydronephrosis or solid mass identified.  Left Kidney:  Length: 10.7 cm. Mild diffuse increased parenchymal echotexture suggesting medical renal disease. No hydronephrosis or solid mass identified.  Bladder:  Bladder is decompressed with a Foley catheter and cannot be evaluated.  Incidental mode of sludge in the gallbladder. No discrete stone or wall thickening is appreciated.  IMPRESSION: No hydronephrosis in either kidney. Increased renal parenchymal echotexture bilaterally consistent with chronic medical renal disease.   Electronically Signed   By: Lucienne Capers M.D.   On: 05/07/2014 03:30   Dg Chest Port 1 View  05/14/2014   CLINICAL DATA:  Pneumonia  EXAM: PORTABLE CHEST - 1 VIEW  COMPARISON:  05/12/2014  FINDINGS: Tracheostomy tube in satisfactory position. No significant pleural effusion or pneumothorax. Bilateral perihilar interstitial thickening, left greater than right. The right perihilar airspace opacity demonstrates  interval resolution. Stable cardiomediastinal silhouette.  The osseous structures are unremarkable.  IMPRESSION: 1. Bilateral interstitial thickening, left greater than right, which may reflect mild interstitial edema versus chronic interstitial lung disease.   Electronically Signed   By: Kathreen Devoid   On: 05/14/2014 17:20   Dg Chest Port 1 View  05/12/2014   CLINICAL DATA:  80 year old male with dyspnea.  EXAM: PORTABLE CHEST - 1 VIEW  COMPARISON:  Chest x-ray 05/08/2014.  FINDINGS: Tracheostomy tube in position with tip approximately 8.3 cm above the carina. There is a Left upper extremity PICC with tip terminating in the mid superior vena cava. Again noted are patchy areas of interstitial prominence and ill-defined airspace disease, throughout the mid to lower lungs bilaterally, most confluent in the right perihilar region extending to the periphery of the right lung, where there is some overlying pleural thickening between the right fourth and fifth ribs. Heart size and mediastinal contours are within normal limits. Atherosclerosis in the thoracic aorta.  IMPRESSION: 1. Support apparatus, as above. 2. Findings are concerning for multilobar bronchopneumonia, possibly with a tiny developing peripherally loculated pleural effusion in the lateral right hemithorax. 3. Atherosclerosis.   Electronically Signed   By: Vinnie Langton M.D.   On: 05/12/2014 12:53   Dg Chest Port 1 View  05/08/2014   CLINICAL DATA:  PICC line adjustment.  EXAM: PORTABLE CHEST - 1 VIEW  COMPARISON:  05/08/2014 and 05/06/2014.  FINDINGS: 2213 hr. Left arm PICC extends to the level of the lower SVC. There is no suggestion of a curved distal appearance on this repeat AP view. The previously noted right arm PICC overlapping the right axilla is no longer visualized. Tracheostomy appears unchanged. There is stable perihilar opacity within the right lung. The heart size and mediastinal contours are stable.  IMPRESSION: Left arm PICC  extends to the level of the lower SVC and demonstrates a normal configuration on this single AP view.   Electronically Signed   By: Camie Patience M.D.   On: 05/08/2014 22:27   Dg Chest Port 1 View  05/08/2014   CLINICAL DATA:  80 year old male with central line placement. Initial encounter.  EXAM: PORTABLE CHEST - 1 VIEW  COMPARISON:  05/06/2014.  FINDINGS: Portable AP view at 2100 hrs.  There is what appears  to be a persistent right upper extremity PICC line catheter terminating at the axilla.  Stable tracheostomy.  New left upper extremity approach PICC line catheter, terminates just above the carina, but has a curved distal appearance, such that it may be terminating in the azygos arch. A lateral view would confirm.  Stable cardiac size and mediastinal contours. Interval increased right perihilar opacity. No pneumothorax and stable ventilation elsewhere.  IMPRESSION: 1. Left upper extremity PICC line placed, terminating in the SVC but may be extending into the azygos vein. A lateral view (could be a portable cross-table lateral if necessary) would confirm. 2. New right perihilar patchy opacity could reflect atelectasis, aspiration, or pneumonia. 3. Stable right upper extremity approach catheter terminating in the axilla.   Electronically Signed   By: Lars Pinks M.D.   On: 05/08/2014 21:23   Dg Chest Portable 1 View  05/06/2014   CLINICAL DATA:  Fever.  EXAM: PORTABLE CHEST - 1 VIEW  COMPARISON:  None.  FINDINGS: The heart size and mediastinal contours are within normal limits. Tracheostomy is in grossly good position. Right-sided PICC line is noted with tip in the expected position of right axillary vein. No pneumothorax or significant pleural effusion is noted. Calcified pleural plaques are seen bilaterally suggesting asbestos exposure. No acute pulmonary disease is noted. The visualized skeletal structures are unremarkable.  IMPRESSION: Right-sided PICC line tip seen in expected position of right axillary  vein. Tracheostomy tube in grossly good position. Calcified pleural plaques suggesting asbestos exposure.   Electronically Signed   By: Sabino Dick M.D.   On: 05/06/2014 21:33    Microbiology: Recent Results (from the past 240 hour(s))  URINE CULTURE     Status: None   Collection Time    05/06/14  7:04 PM      Result Value Ref Range Status   Specimen Description URINE, CLEAN CATCH   Final   Special Requests NONE   Final   Culture  Setup Time     Final   Value: 05/06/2014 20:38     Performed at [place]     Final   Value: >=100,000 COLONIES/ML     Performed at [facility]   Culture     Final   Value: YEAST     Performed at [facility]   Report Status 05/08/2014 FINAL   Final  CULTURE, BLOOD (ROUTINE X 2)     Status: None   Collection Time    05/06/14  8:52 PM      Result Value Ref Range Status   Specimen Description BLOOD ARM LEFT   Final   Special Requests BOTTLES DRAWN AEROBIC AND ANAEROBIC 10CC   Final   Culture  Setup Time     Final   Value: 05/07/2014 01:18     Performed at [facility]   Culture     Final   Value: NO GROWTH 5 DAYS     Performed at [facility]   Report Status 05/13/2014 FINAL   Final  CULTURE, BLOOD (ROUTINE X 2)     Status: None   Collection Time    05/06/14  9:00 PM      Result Value Ref Range Status   Specimen Description BLOOD HAND LEFT   Final   Special Requests BOTTLES DRAWN AEROBIC ONLY [place]   Final   Culture  Setup Time     Final   Value: 05/07/2014 01:18  Performed at [facility]     Final   Value: NO GROWTH 5 DAYS     Performed at [facility]   Report Status 05/13/2014 FINAL   Final  MRSA PCR SCREENING     Status: Abnormal   Collection Time    05/07/14 12:35 PM      Result Value Ref Range Status   MRSA by PCR POSITIVE (*) NEGATIVE Final   Comment:            The GeneXpert MRSA Assay (FDA     approved for NASAL specimens     only), is one  component of a     comprehensive MRSA colonization     surveillance program. It is not     intended to diagnose MRSA     infection nor to guide or     monitor treatment for     MRSA infections.     RESULT CALLED TO, READ BACK BY AND VERIFIED WITH:     WELLINGTON RN 14:05 05/07/14 ([email or web address])  CULTURE, RESPIRATORY (NON-EXPECTORATED)     Status: None   Collection Time    05/09/14  9:15 AM      Result Value Ref Range Status   Specimen Description TRACHEAL ASPIRATE   Final   Special Requests NONE   Final   Gram Stain     Final   Value: FEW WBC PRESENT, PREDOMINANTLY PMN     NO SQUAMOUS EPITHELIAL CELLS SEEN     RARE GRAM NEGATIVE RODS     Performed at [facility]   Culture     Final   Value: MODERATE STENOTROPHOMONAS MALTOPHILIA     Performed at [facility]   Report Status 05/13/2014 FINAL   Final   Organism ID, Bacteria STENOTROPHOMONAS MALTOPHILIA   Final  CULTURE, RESPIRATORY (NON-EXPECTORATED)     Status: None   Collection Time    05/14/14  2:26 PM      Result Value Ref Range Status   Specimen Description TRACHEAL ASPIRATE   Final   Special Requests NONE   Final   Gram Stain     Final   Value: FEW WBC PRESENT, PREDOMINANTLY MONONUCLEAR     NO SQUAMOUS EPITHELIAL CELLS SEEN     ABUNDANT GRAM NEGATIVE RODS     Performed at [facility]     Final   Value: Culture reincubated for better growth     Performed at [facility]   Report Status PENDING   Incomplete  CULTURE, RESPIRATORY (NON-EXPECTORATED)     Status: None   Collection Time    05/14/14  3:37 PM      Result Value Ref Range Status   Specimen Description TRACHEAL ASPIRATE   Final   Special Requests NONE   Final   Gram Stain     Final   Value: FEW WBC PRESENT, PREDOMINANTLY MONONUCLEAR     NO SQUAMOUS EPITHELIAL CELLS SEEN     ABUNDANT GRAM NEGATIVE RODS     Performed at [facility]   Culture     Final   Value: Culture reincubated for better growth     Performed  at [facility]   Report Status PENDING   Incomplete    Labs: Basic Metabolic Panel:  Recent Labs Lab 05/10/14 0650 05/11/14 1100 05/13/14 0500 05/14/14 0500 05/15/14 0500  NA 143 143 143 146 146  K 4.5 4.3 4.3 4.6 4.6  CL 108 108 108 110 111  CO2 $Re'24 22 23 24 26  'Ajj$ GLUCOSE 194* 280* 301* 230* 182*  BUN 97* 97* 96* 89* 86*  CREATININE 3.05* 3.15* 3.10* 2.77* 2.37*  CALCIUM 9.1 8.9 9.4 9.8 9.9  MG  --   --   --   --  1.8   Liver Function Tests:  Recent Labs Lab 05/14/14 0500 05/15/14 0500  AST 11 9  ALT 15 13  ALKPHOS 124* 114  BILITOT 0.2* 0.2*  PROT 6.3 6.6  ALBUMIN 1.7* 1.8*   CBC:  Recent Labs Lab 05/09/14 0500 05/10/14 0650 05/11/14 1100 05/13/14 0500 05/14/14 0109 05/15/14 0500  WBC 11.7* 14.7* 19.1* 14.9* 15.2* 17.6*  NEUTROABS 9.2*  --   --   --   --  16.9*  HGB 8.5* 8.2* 7.8* 7.6* 7.9* 8.4*  HCT 25.9* 25.9* 24.9* 24.1* 25.4* 27.3*  MCV 83.8 83.0 84.7 86.1 84.4 86.7  PLT 206 219 273 310 331 364   BNP (last 3 results)  Recent Labs  05/07/14 0213 05/07/14 1317 05/12/14 1100  PROBNP 3433.0* 2701.0* 3477.0*   CBG:  Recent Labs Lab 05/15/14 0422 05/15/14 0742 05/15/14 0958 05/15/14 1213 05/15/14 1422  GLUCAP 152* 166* 225* 269* 247*   Signed:  ELLIS,ALLISON L. ANP [facility] 05/15/2014, 2:57 PM  I have personally examined this patient and reviewed the entire database. I have reviewed the above note, made any necessary editorial changes, and agree with its content.  Cherene Altes, MD [facility]

## 2014-05-15 NOTE — Clinical Social Work Note (Signed)
CSW confirmed with POA that patient can go to [facility] today.  Patient will discharge to [facility] Anticipated discharge date:05/15/14 Family notified:10/28/15Corrie Dandy[name], [place] [facility] [employer]- scheduled for 4:30  CSW signing off.  Merlyn LotJenna Holoman, LCSWA Clinical Social Worker [phone] .

## 2014-05-16 LAB — GLUCOSE, CAPILLARY: GLUCOSE-CAPILLARY: 261 mg/dL — AB (ref 70–99)

## 2014-05-18 LAB — CULTURE, RESPIRATORY

## 2014-05-18 LAB — CULTURE, RESPIRATORY W GRAM STAIN

## 2014-05-29 DIAGNOSIS — J9621 Acute and chronic respiratory failure with hypoxia: Secondary | ICD-10-CM | POA: Insufficient documentation

## 2014-05-29 DIAGNOSIS — Z515 Encounter for palliative care: Secondary | ICD-10-CM | POA: Insufficient documentation

## 2014-05-29 DIAGNOSIS — IMO0001 Reserved for inherently not codable concepts without codable children: Secondary | ICD-10-CM | POA: Insufficient documentation

## 2014-06-15 ENCOUNTER — Encounter (HOSPITAL_COMMUNITY): Payer: Self-pay

## 2014-06-15 ENCOUNTER — Inpatient Hospital Stay (HOSPITAL_COMMUNITY)
Admission: EM | Admit: 2014-06-15 | Discharge: 2014-06-17 | DRG: 871 | Disposition: A | Discharge status: Other Acute Inpatient Hospital | Payer: Medicare Other | Attending: Pulmonary Disease | Admitting: Pulmonary Disease

## 2014-06-15 ENCOUNTER — Emergency Department (HOSPITAL_COMMUNITY): Payer: Medicare Other

## 2014-06-15 DIAGNOSIS — Z9911 Dependence on respirator [ventilator] status: Secondary | ICD-10-CM | POA: Diagnosis not present

## 2014-06-15 DIAGNOSIS — Z7901 Long term (current) use of anticoagulants: Secondary | ICD-10-CM

## 2014-06-15 DIAGNOSIS — R4702 Dysphasia: Secondary | ICD-10-CM | POA: Diagnosis present

## 2014-06-15 DIAGNOSIS — N19 Unspecified kidney failure: Secondary | ICD-10-CM

## 2014-06-15 DIAGNOSIS — Z931 Gastrostomy status: Secondary | ICD-10-CM | POA: Diagnosis not present

## 2014-06-15 DIAGNOSIS — D631 Anemia in chronic kidney disease: Secondary | ICD-10-CM | POA: Diagnosis present

## 2014-06-15 DIAGNOSIS — R1310 Dysphagia, unspecified: Secondary | ICD-10-CM | POA: Diagnosis present

## 2014-06-15 DIAGNOSIS — N179 Acute kidney failure, unspecified: Secondary | ICD-10-CM

## 2014-06-15 DIAGNOSIS — E871 Hypo-osmolality and hyponatremia: Secondary | ICD-10-CM | POA: Diagnosis present

## 2014-06-15 DIAGNOSIS — A419 Sepsis, unspecified organism: Principal | ICD-10-CM | POA: Diagnosis present

## 2014-06-15 DIAGNOSIS — Z888 Allergy status to other drugs, medicaments and biological substances status: Secondary | ICD-10-CM

## 2014-06-15 DIAGNOSIS — I509 Heart failure, unspecified: Secondary | ICD-10-CM | POA: Diagnosis present

## 2014-06-15 DIAGNOSIS — Z66 Do not resuscitate: Secondary | ICD-10-CM | POA: Diagnosis present

## 2014-06-15 DIAGNOSIS — E1165 Type 2 diabetes mellitus with hyperglycemia: Secondary | ICD-10-CM | POA: Diagnosis present

## 2014-06-15 DIAGNOSIS — E1122 Type 2 diabetes mellitus with diabetic chronic kidney disease: Secondary | ICD-10-CM | POA: Diagnosis present

## 2014-06-15 DIAGNOSIS — Z22322 Carrier or suspected carrier of Methicillin resistant Staphylococcus aureus: Secondary | ICD-10-CM

## 2014-06-15 DIAGNOSIS — Z882 Allergy status to sulfonamides status: Secondary | ICD-10-CM

## 2014-06-15 DIAGNOSIS — N189 Chronic kidney disease, unspecified: Secondary | ICD-10-CM

## 2014-06-15 DIAGNOSIS — E1129 Type 2 diabetes mellitus with other diabetic kidney complication: Secondary | ICD-10-CM

## 2014-06-15 DIAGNOSIS — J9610 Chronic respiratory failure, unspecified whether with hypoxia or hypercapnia: Secondary | ICD-10-CM | POA: Diagnosis present

## 2014-06-15 DIAGNOSIS — D62 Acute posthemorrhagic anemia: Secondary | ICD-10-CM | POA: Insufficient documentation

## 2014-06-15 DIAGNOSIS — Z930 Tracheostomy status: Secondary | ICD-10-CM | POA: Diagnosis not present

## 2014-06-15 DIAGNOSIS — Z794 Long term (current) use of insulin: Secondary | ICD-10-CM

## 2014-06-15 DIAGNOSIS — J449 Chronic obstructive pulmonary disease, unspecified: Secondary | ICD-10-CM | POA: Diagnosis present

## 2014-06-15 DIAGNOSIS — I129 Hypertensive chronic kidney disease with stage 1 through stage 4 chronic kidney disease, or unspecified chronic kidney disease: Secondary | ICD-10-CM | POA: Diagnosis present

## 2014-06-15 DIAGNOSIS — Z880 Allergy status to penicillin: Secondary | ICD-10-CM

## 2014-06-15 DIAGNOSIS — D638 Anemia in other chronic diseases classified elsewhere: Secondary | ICD-10-CM

## 2014-06-15 DIAGNOSIS — N184 Chronic kidney disease, stage 4 (severe): Secondary | ICD-10-CM | POA: Diagnosis present

## 2014-06-15 DIAGNOSIS — E872 Acidosis: Secondary | ICD-10-CM | POA: Diagnosis present

## 2014-06-15 DIAGNOSIS — N170 Acute kidney failure with tubular necrosis: Secondary | ICD-10-CM | POA: Diagnosis present

## 2014-06-15 DIAGNOSIS — G9341 Metabolic encephalopathy: Secondary | ICD-10-CM | POA: Diagnosis present

## 2014-06-15 DIAGNOSIS — J9611 Chronic respiratory failure with hypoxia: Secondary | ICD-10-CM | POA: Diagnosis not present

## 2014-06-15 LAB — BASIC METABOLIC PANEL
ANION GAP: 17 — AB (ref 5–15)
BUN: 177 mg/dL — ABNORMAL HIGH (ref 6–23)
CHLORIDE: 85 meq/L — AB (ref 96–112)
CO2: 21 meq/L (ref 19–32)
Calcium: 10.4 mg/dL (ref 8.4–10.5)
Creatinine, Ser: 3.76 mg/dL — ABNORMAL HIGH (ref 0.50–1.35)
GFR calc Af Amer: 15 mL/min — ABNORMAL LOW (ref >90)
GFR calc non Af Amer: 13 mL/min — ABNORMAL LOW (ref >90)
Glucose, Bld: 403 mg/dL — ABNORMAL HIGH (ref 70–99)
Potassium: 4.6 mEq/L (ref 3.7–5.3)
SODIUM: 123 meq/L — AB (ref 137–147)

## 2014-06-15 LAB — URINE MICROSCOPIC-ADD ON

## 2014-06-15 LAB — URINALYSIS, ROUTINE W REFLEX MICROSCOPIC
BILIRUBIN URINE: NEGATIVE
GLUCOSE, UA: 100 mg/dL — AB
Ketones, ur: NEGATIVE mg/dL
Nitrite: NEGATIVE
Protein, ur: NEGATIVE mg/dL
SPECIFIC GRAVITY, URINE: 1.012 (ref 1.005–1.030)
Urobilinogen, UA: 0.2 mg/dL (ref 0.0–1.0)
pH: 5 (ref 5.0–8.0)

## 2014-06-15 LAB — GLUCOSE, CAPILLARY
GLUCOSE-CAPILLARY: 277 mg/dL — AB (ref 70–99)
Glucose-Capillary: 311 mg/dL — ABNORMAL HIGH (ref 70–99)

## 2014-06-15 LAB — I-STAT ARTERIAL BLOOD GAS, ED
Acid-base deficit: 4 mmol/L — ABNORMAL HIGH (ref 0.0–2.0)
BICARBONATE: 21.6 meq/L (ref 20.0–24.0)
O2 SAT: 98 %
PH ART: 7.326 — AB (ref 7.350–7.450)
Patient temperature: 98.6
TCO2: 23 mmol/L (ref 0–100)
pCO2 arterial: 41.4 mmHg (ref 35.0–45.0)
pO2, Arterial: 112 mmHg — ABNORMAL HIGH (ref 80.0–100.0)

## 2014-06-15 LAB — MRSA PCR SCREENING: MRSA BY PCR: POSITIVE — AB

## 2014-06-15 LAB — I-STAT CG4 LACTIC ACID, ED: LACTIC ACID, VENOUS: 2.91 mmol/L — AB (ref 0.5–2.2)

## 2014-06-15 LAB — PREPARE RBC (CROSSMATCH)

## 2014-06-15 MED ORDER — LORAZEPAM 2 MG/ML IJ SOLN
0.5000 mg | INTRAMUSCULAR | Status: DC | PRN
Start: 2014-06-15 — End: 2014-06-17

## 2014-06-15 MED ORDER — ACETAMINOPHEN 160 MG/5ML PO SOLN: 650.0000 mg | ORAL | Status: DC | PRN

## 2014-06-15 MED ORDER — IPRATROPIUM-ALBUTEROL 0.5-2.5 (3) MG/3ML IN SOLN
3.0000 mL | Freq: Four times a day (QID) | RESPIRATORY_TRACT | Status: DC
  Administered 2014-06-15 – 2014-06-17 (×9): 3 mL via RESPIRATORY_TRACT
  Filled 2014-06-15 (×9): qty 3

## 2014-06-15 MED ORDER — IPRATROPIUM-ALBUTEROL 0.5-2.5 (3) MG/3ML IN SOLN
RESPIRATORY_TRACT | Status: AC
  Administered 2014-06-15: 15:00:00
  Filled 2014-06-15: qty 3

## 2014-06-15 MED ORDER — SODIUM CHLORIDE 0.9 % IV SOLN: 250.0000 mL | INTRAVENOUS | Status: DC | PRN

## 2014-06-15 MED ORDER — CETYLPYRIDINIUM CHLORIDE 0.05 % MT LIQD
7.0000 mL | Freq: Four times a day (QID) | OROMUCOSAL | Status: DC
  Administered 2014-06-16 – 2014-06-17 (×8): 7 mL via OROMUCOSAL

## 2014-06-15 MED ORDER — HYDROCORTISONE NA SUCCINATE PF 100 MG IJ SOLR
50.0000 mg | Freq: Four times a day (QID) | INTRAMUSCULAR | Status: DC
  Administered 2014-06-15 – 2014-06-16 (×4): 50 mg via INTRAVENOUS
  Filled 2014-06-15 (×7): qty 1

## 2014-06-15 MED ORDER — PANTOPRAZOLE SODIUM 40 MG IV SOLR
40.0000 mg | Freq: Two times a day (BID) | INTRAVENOUS | Status: DC
  Administered 2014-06-15 – 2014-06-17 (×5): 40 mg via INTRAVENOUS
  Filled 2014-06-15 (×5): qty 40

## 2014-06-15 MED ORDER — CHLORHEXIDINE GLUCONATE 0.12 % MT SOLN
15.0000 mL | Freq: Two times a day (BID) | OROMUCOSAL | Status: DC
Start: 2014-06-16 — End: 2014-06-17
  Administered 2014-06-16 – 2014-06-17 (×3): 15 mL via OROMUCOSAL
  Filled 2014-06-15 (×4): qty 15

## 2014-06-15 MED ORDER — FENTANYL CITRATE 0.05 MG/ML IJ SOLN: 25.0000 ug | INTRAMUSCULAR | Status: DC | PRN

## 2014-06-15 MED ORDER — SODIUM CHLORIDE 0.9 % IV SOLN
INTRAVENOUS | Status: DC
  Administered 2014-06-15: 16:00:00 via INTRAVENOUS

## 2014-06-15 MED ORDER — SODIUM CHLORIDE 0.9 % IV SOLN: Freq: Once | INTRAVENOUS | Status: DC

## 2014-06-15 NOTE — ED Notes (Signed)
I Stat Lactic Acid results shown to Dr. R. Beaton 

## 2014-06-15 NOTE — ED Notes (Signed)
Family at bedside. 

## 2014-06-15 NOTE — H&P (Signed)
PULMONARY / CRITICAL CARE MEDICINE   Name: Alcus DadForrester Ginger MRN: [id number] DOB: [birth year]    ADMISSION DATE:  06/15/2014 CONSULTATION DATE:  11/28  REFERRING MD :  Radford PaxBeaton  CHIEF COMPLAINT:  Acute mental status change. Acute on chronic renal failure   INITIAL PRESENTATION:  80 year old male w/ h/o COPD, prob OSA/OHS, diastolic dysfunction and vent dependence. Resides at [facility].  [facility]. Recently started on meropenem and flagyl for possible infection at [facility]. [facility] for evaluation of worsening renal failure and decreased MS. In ER found to have hgb 6.5. PCCM asked to admit.   STUDIES:    SIGNIFICANT EVENTS:    HISTORY OF PRESENT ILLNESS:   80 year old male with a history of COPD, chronic respiratory failure, ventilator dependence, CKD stage IV who was [facility] to [facility] on a ventilator.The patient was admitted for sepsis secondary to complicated UTI during his last admission. The patient has a chronic indwelling Foley catheter that was changed possibly 3 days ago prior to admit, according to the patient's daughter. In addition the patient has a new right upper extremity PICC line that was placed possibly one week ago prior to this admission. According to the patient's daughter, there has not been any worsening respiratory distress. In fact, he has been doing better from a respiratory standpoint. There's been no reports of vomiting, uncontrolled pain, respiratory distress, or diarrhea. He has been tolerating his tube feeds. The patient normally receives bolus feeds every 6 hours. Most recently, the patient was noted to have increasing to be WBC and was started on meropenem and Flagyl at [facility]. His renal function continued to worsen over the past 3 days which prompted the patient to transfer to the emergency department. In the emergency department, the patient was noted to have  WBC 17,000, sodium 123, serum glucose 403, serum creatinine 2.76 with an anion gap of 17. Chest x-ray shows possible left upper lobe opacity. Blood gas revealed 7.32/41/112/23 on 35%. Hemoglobin was noted to be 6.5 with platelets 162,000. He was initially seen by IM service but given his multiple medical issues PCCM asked to admit   PAST MEDICAL HISTORY :   has a past medical history of COPD (chronic obstructive pulmonary disease); Anemia; Renal disorder; Dysphasia; Tracheostomy dependent; Diabetes mellitus without complication; and CHF (congestive heart failure).  has no past surgical history on file. Prior to Admission medications   Medication Sig Start Date End Date Taking? Authorizing Provider  acetaminophen (TYLENOL) 325 MG tablet Place 2 tablets (650 mg total) into feeding tube every 6 (six) hours as needed for mild pain (or Fever >/= 101). 05/15/14  Yes Russella DarAllison L Ellis, NP  Carboxymethylcellulose Sodium (REFRESH TEARS OP) Place 1 drop into both eyes every 6 (six) hours as needed (dry eyes).   Yes Historical Provider, MD  chlorhexidine (PERIDEX) 0.12 % solution Use as directed 15 mLs in the mouth or throat 2 (two) times daily.   Yes Historical Provider, MD  dexamethasone (DECADRON) 4 MG tablet 4 mg by PEG Tube route daily.   Yes Historical Provider, MD  diphenhydrAMINE (BENADRYL) 12.5 MG/5ML liquid Take 25 mg by mouth every 6 (six) hours as needed (reaction).   Yes Historical Provider, MD  diphenhydrAMINE-zinc acetate (BENADRYL) cream Apply topically 3 (three) times daily as needed for itching. 05/15/14  Yes Russella DarAllison L Ellis, NP  docusate (COLACE) 50 MG/5ML liquid 100 mg by PEG Tube route  2 (two) times daily.   Yes Historical Provider, MD  famotidine (PEPCID) 20 MG tablet 20 mg by PEG Tube route 2 (two) times daily.   Yes Historical Provider, MD  heparin 5000 UNIT/ML injection Inject 1 mL (5,000 Units total) into the skin every 8 (eight) hours. 05/15/14  Yes Russella Dar, NP  insulin aspart  (NOVOLOG) 100 UNIT/ML injection Inject 5-20 Units into the skin every 6 (six) hours as needed for high blood sugar. 200-300=5 units 301-400=10 units 401-500=15 units If greater than 500 give 20 units and call MD.   Yes Historical Provider, MD  insulin detemir (LEVEMIR) 100 UNIT/ML injection Inject 0.2 mLs (20 Units total) into the skin daily. Patient taking differently: Inject 20-25 Units into the skin 2 (two) times daily. 20 units at 10am and 25 units at 9pm 05/15/14  Yes Russella Dar, NP  levalbuterol (XOPENEX) 1.25 MG/0.5ML nebulizer solution Take 1.25 mg by nebulization every 4 (four) hours as needed for wheezing or shortness of breath. 05/15/14  Yes Russella Dar, NP  Meropenem-Sodium Chloride 500 MG/50ML SOLR Inject 500 mg into the vein every 12 (twelve) hours.   Yes Historical Provider, MD  metroNIDAZOLE (FLAGYL) 500 MG tablet Take 500 mg by mouth 4 (four) times daily.   Yes Historical Provider, MD  Multiple Vitamins-Minerals (MULTIVITAMIN WITH MINERALS) tablet 1 tablet by PEG Tube route daily.   Yes Historical Provider, MD  NON FORMULARY Enteral feed. If residuals are greater than hold feeding. Elevate head of bed 30-45 degrees during feeding and 30-60 mins after.   Yes Historical Provider, MD  Nutritional Supplements (FEEDING SUPPLEMENT, NEPRO CARB STEADY,) LIQD Place 1,000 mLs into feeding tube continuous. 05/15/14  Yes Russella Dar, NP  senna (SENOKOT) 8.6 MG tablet 1 tablet by PEG Tube route daily.   Yes Historical Provider, MD  vitamin C (ASCORBIC ACID) 500 MG tablet 500 mg by PEG Tube route daily.   Yes Historical Provider, MD  acetylcysteine (MUCOMYST) 20 % nebulizer solution Take 3 mLs by nebulization 3 (three) times daily. 05/15/14   Russella Dar, NP  antiseptic oral rinse (CPC / CETYLPYRIDINIUM CHLORIDE 0.05%) 0.05 % LIQD solution 7 mLs by Mouth Rinse route QID. 05/15/14   Russella Dar, NP  insulin detemir (LEVEMIR) 100 UNIT/ML injection Inject 0.1 mLs (10 Units  total) into the skin at bedtime. Patient not taking: Reported on 06/15/2014 05/15/14   Russella Dar, NP  sodium chloride 0.9 % injection 10-40 mLs by Intracatheter route every 12 (twelve) hours. 05/15/14   Russella Dar, NP  sodium chloride 0.9 % injection 10-40 mLs by Intracatheter route as needed (flush). 05/15/14   Russella Dar, NP  sodium chloride 0.9 % injection Inject 3 mLs into the vein every 12 (twelve) hours. 05/15/14   Russella Dar, NP  Water For Irrigation, Sterile (FREE WATER) SOLN Place 250 mLs into feeding tube every 6 (six) hours. 05/15/14   Russella Dar, NP   Allergies  Allergen Reactions  . Sulfa Antibiotics Other (See Comments)    unknown  . Vancomycin Other (See Comments)    unknown  . Vioxx [Rofecoxib] Other (See Comments)    unknown  . Zyvox [Linezolid] Other (See Comments)    unknown    FAMILY HISTORY:  has no family status information on file.  SOCIAL HISTORY:  reports that he does not drink alcohol.  REVIEW OF SYSTEMS:  Unable   SUBJECTIVE:  Sedated on vent   VITAL SIGNS:  Temp:  [97.8 F (36.6 C)] 97.8 F (36.6 C) (11/28 1115) Pulse Rate:  [54-100] 99 (11/28 1315) Resp:  [18-24] 23 (11/28 1315) BP: (83-146)/(46-85) 83/46 mmHg (11/28 1315) SpO2:  [95 %-100 %] 100 % (11/28 1315) FiO2 (%):  [35 %] 35 % (11/28 1100) HEMODYNAMICS:   VENTILATOR SETTINGS: Vent Mode:  [-] PRVC FiO2 (%):  [35 %] 35 % Set Rate:  [22 bmp] 22 bmp Vt Set:  [550 mL] 550 mL PEEP:  [5 cmH20] 5 cmH20 Plateau Pressure:  [24 cmH20] 24 cmH20 INTAKE / OUTPUT: No intake or output data in the 24 hours ending 06/15/14 1341  PHYSICAL EXAMINATION: General:  Chronically ill appearing 80 year old male. Unresponsive on vent  Neuro:  Opens eyes to painful stim HEENT:  Trach unremarkable  Cardiovascular:  rrr Lungs:  Prolonged exp wheeze  Abdomen:  Soft, PEG in place  Musculoskeletal:  Intact  Skin:  Intact dependent edema   LABS:  CBC  Recent Labs Lab  06/15/14 1140  WBC 17.0*  HGB 6.5*  HCT 20.1*  PLT 162   Coag's No results for input(s): APTT, INR in the last 168 hours. BMET  Recent Labs Lab 06/15/14 1140  NA 123*  K 4.6  CL 85*  CO2 21  BUN 177*  CREATININE 3.76*  GLUCOSE 403*   Electrolytes  Recent Labs Lab 06/15/14 1140  CALCIUM 10.4   Sepsis Markers  Recent Labs Lab 06/15/14 1209  LATICACIDVEN 2.91*   ABG  Recent Labs Lab 06/15/14 1145  PHART 7.326*  PCO2ART 41.4  PO2ART 112.0*   Liver Enzymes No results for input(s): AST, ALT, ALKPHOS, BILITOT, ALBUMIN in the last 168 hours. Cardiac Enzymes No results for input(s): TROPONINI, PROBNP in the last 168 hours. Glucose No results for input(s): GLUCAP in the last 168 hours.  Imaging No results found.   ASSESSMENT / PLAN:  PULMONARY Chronic trach  A:  Chronic respiratory failure Trach status Ventilator dependence  COPD P:   Cont full vent support Scheduled BDs Obtain sputum culture   CARDIOVASCULAR CVL RUE PICC placed outside facility  A:  h/o diastolic HF Cor pulmonale Shock etiology unclear. Consider sepsis (had pan-resistant orgs)vs blood loss anemia c/b Adrenal insuff  Chronic BBB  Difficult IV access  P:  Tele  Gentle hydration  Stress dose steroids  DNR  RENAL A:  acute on chronic renal failure (stage III) On presentation to [facility] scr was 2.1, now 3.76 w BUN 177 Hyponatremia  Mild + anion gap P:   Ns at 57ml/hr Renal dose meds Trend chemistry  Not a candidate for HD  GASTROINTESTINAL A:   Dysphagia PEG dependent  PCM P:   Start tube feeds PPI for SUP  HEMATOLOGIC A:   Acute on chronic anemia: baseline hgb 8.4, now 6.5.  P:  Consider Start epo Heme-occult stool Transfuse per ICU guidelines PPI BID    INFECTIOUS A:   Leukocytosis  H/o MDR serratia and klebsiella in sputum  P:   BCx2 11/28>>> UC  11/28>>> Sputum 11/28>>> Hold off on abx for now   ENDOCRINE A:   Dm w/ hyperglycemia   pred dependent/ adrenal dysfxn   P:   Ck serum ketones ssi Hydration   NEUROLOGIC A:  Acute encephalopathy: seems metabolic in nature. Suspect mix of renal failure, hyponatremia and r/o sepsis  P:   RASS goal: -1 Supportive care    FAMILY  - Updates: family updated at length by Dr Sung Amabile. See below.   -  Inter-disciplinary family meet or Palliative Care meeting due by: 12/5  NP summary  Will admit to ICU w/ working dx of shock. Not sure if this is sepsis or blood loss related. Will admit him to ICU. Transfuse, Pan culture, hydrate, hold all anticoagulation, get FOB add PPI SUP and get renal consult. Will obtain palliative care consult and also renal consult. He is not a candidate for HD.   Simonne MartinetPeter E Babcock ACNP-BC [facility] Pulmonary/Critical Care Pager # [phone] OR # [phone] if no answer  PCCM ATTENDING: Pt seen on work rounds with care provider noted above. We reviewed pt's initial presentation, consultants notes and hospital database in detail.  The above assessment and plan was formulated under my direction.  In summary: This very elderly man has been chronically ill, hospitalized and ventilator dependent for several months. 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 He has CHF and CKD as well. He appears to be on a long slow course of deterioration over the past several months. [facility]. The records from that hospitalization have been reviewed. He has been [facility] to [facility] ED for worsening renal failure and is found to be anemic and hypotensive.   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 We discussed his current critical illness in the context of his chronically ill and debilitated state. She is emotionally unable to make a decision to limit care and even questioned whether he could undergo kidney transplantation. I made it clear to her that we would provide only those measures that have a reasonable likelihood of benefit to him. To that end, I concur with Dr Hyman HopesWebb that he is not presently and will not likely ever be a candidate for HD. Further, I explained that we would not administer ACLS/CPR in the event of cardiac arrest. She expressed appreciation for the care that we are providing.   I think that the Palliative Care service could help facilitate her coping with the reality that her father is at the end of his life. Other [place] from [place] and I will meet with them tomorrow   45 mins CCM time spent by myself independent of the work performed by [employer] Babcock. The majority of this was in consultation with the pt's daughter   Billy Fischeravid Lindell Tussey, MD;  PCCM service; Mobile [phone]  06/15/2014, 1:41 PM

## 2014-06-15 NOTE — ED Notes (Signed)
IV Team flushed picc line. And critical care pete stated ok to use picc.

## 2014-06-15 NOTE — Consult Note (Signed)
Referring Provider: No ref. provider found Primary Care Physician:  Hillary Bow, MD Primary Nephrologist:    Reason for Consultation:  Acute non-oliguric renal failure, hypotension and azotemia  HPI: This is a 80 year old man who has a history of COPD and Congestive heart failure that was initially intubated in placed on the ventilator in June 2015 in [place]. He was a slow ventilator wean and was eventually given a tracheostomy and PEG tube and transferred to [facility] in August. He was seen in ER in October with urinary retention secondary to a clot in the chronic indwelling foley catheter. He was also seen to have Vancomycin susceptible enterococcus.   Past Medical History  Diagnosis Date  . COPD (chronic obstructive pulmonary disease)   . Anemia   . Renal disorder   . Dysphasia   . Tracheostomy dependent   . Diabetes mellitus without complication   . CHF (congestive heart failure)     History reviewed. No pertinent past surgical history.  Prior to Admission medications   Medication Sig Start Date End Date Taking? Authorizing Provider  acetaminophen (TYLENOL) 325 MG tablet Place 2 tablets (650 mg total) into feeding tube every 6 (six) hours as needed for mild pain (or Fever >/= 101). 05/15/14  Yes Russella Dar, NP  Carboxymethylcellulose Sodium (REFRESH TEARS OP) Place 1 drop into both eyes every 6 (six) hours as needed (dry eyes).   Yes Historical Provider, MD  chlorhexidine (PERIDEX) 0.12 % solution Use as directed 15 mLs in the mouth or throat 2 (two) times daily.   Yes Historical Provider, MD  dexamethasone (DECADRON) 4 MG tablet 4 mg by PEG Tube route daily.   Yes Historical Provider, MD  diphenhydrAMINE (BENADRYL) 12.5 MG/5ML liquid Take 25 mg by mouth every 6 (six) hours as needed (reaction).   Yes Historical Provider, MD  diphenhydrAMINE-zinc acetate (BENADRYL) cream Apply topically 3 (three) times daily as needed for itching. 05/15/14  Yes  Russella Dar, NP  docusate (COLACE) 50 MG/5ML liquid 100 mg by PEG Tube route 2 (two) times daily.   Yes Historical Provider, MD  famotidine (PEPCID) 20 MG tablet 20 mg by PEG Tube route 2 (two) times daily.   Yes Historical Provider, MD  heparin 5000 UNIT/ML injection Inject 1 mL (5,000 Units total) into the skin every 8 (eight) hours. 05/15/14  Yes Russella Dar, NP  insulin aspart (NOVOLOG) 100 UNIT/ML injection Inject 5-20 Units into the skin every 6 (six) hours as needed for high blood sugar. 200-300=5 units 301-400=10 units 401-500=15 units If greater than 500 give 20 units and call MD.   Yes Historical Provider, MD  insulin detemir (LEVEMIR) 100 UNIT/ML injection Inject 0.2 mLs (20 Units total) into the skin daily. Patient taking differently: Inject 20-25 Units into the skin 2 (two) times daily. 20 units at 10am and 25 units at 9pm 05/15/14  Yes Russella Dar, NP  levalbuterol (XOPENEX) 1.25 MG/0.5ML nebulizer solution Take 1.25 mg by nebulization every 4 (four) hours as needed for wheezing or shortness of breath. 05/15/14  Yes Russella Dar, NP  Meropenem-Sodium Chloride 500 MG/50ML SOLR Inject 500 mg into the vein every 12 (twelve) hours.   Yes Historical Provider, MD  metroNIDAZOLE (FLAGYL) 500 MG tablet Take 500 mg by mouth 4 (four) times daily.   Yes Historical Provider, MD  Multiple Vitamins-Minerals (MULTIVITAMIN WITH MINERALS) tablet 1 tablet by PEG Tube route daily.   Yes Historical Provider, MD  NON FORMULARY Enteral  feed. If residuals are greater than 250ml hold feeding. Elevate head of bed 30-45 degrees during feeding and 30-60 mins after.   Yes Historical Provider, MD  Nutritional Supplements (FEEDING SUPPLEMENT, NEPRO CARB STEADY,) LIQD Place 1,000 mLs into feeding tube continuous. 05/15/14  Yes Russella DarAllison L Ellis, NP  senna (SENOKOT) 8.6 MG tablet 1 tablet by PEG Tube route daily.   Yes Historical Provider, MD  vitamin C (ASCORBIC ACID) 500 MG tablet 500 mg by PEG Tube  route daily.   Yes Historical Provider, MD  acetylcysteine (MUCOMYST) 20 % nebulizer solution Take 3 mLs by nebulization 3 (three) times daily. 05/15/14   Russella DarAllison L Ellis, NP  antiseptic oral rinse (CPC / CETYLPYRIDINIUM CHLORIDE 0.05%) 0.05 % LIQD solution 7 mLs by Mouth Rinse route QID. 05/15/14   Russella DarAllison L Ellis, NP  insulin detemir (LEVEMIR) 100 UNIT/ML injection Inject 0.1 mLs (10 Units total) into the skin at bedtime. Patient not taking: Reported on 06/15/2014 05/15/14   Russella DarAllison L Ellis, NP  sodium chloride 0.9 % injection 10-40 mLs by Intracatheter route every 12 (twelve) hours. 05/15/14   Russella DarAllison L Ellis, NP  sodium chloride 0.9 % injection 10-40 mLs by Intracatheter route as needed (flush). 05/15/14   Russella DarAllison L Ellis, NP  sodium chloride 0.9 % injection Inject 3 mLs into the vein every 12 (twelve) hours. 05/15/14   Russella DarAllison L Ellis, NP  Water For Irrigation, Sterile (FREE WATER) SOLN Place 250 mLs into feeding tube every 6 (six) hours. 05/15/14   Russella DarAllison L Ellis, NP    Current Facility-Administered Medications  Medication Dose Route Frequency Provider Last Rate Last Dose  . 0.9 %  sodium chloride infusion  250 mL Intravenous PRN Merwyn Katosavid B Simonds, MD      . 0.9 %  sodium chloride infusion   Intravenous Continuous Merwyn Katosavid B Simonds, MD      . 0.9 %  sodium chloride infusion   Intravenous Once Merwyn Katosavid B Simonds, MD      . acetaminophen (TYLENOL) solution 650 mg  650 mg Per Tube Q4H PRN Merwyn Katosavid B Simonds, MD      . fentaNYL (SUBLIMAZE) injection 25-100 mcg  25-100 mcg Intravenous Q2H PRN Merwyn Katosavid B Simonds, MD      . hydrocortisone sodium succinate (SOLU-CORTEF) 100 MG injection 50 mg  50 mg Intravenous Q6H Merwyn Katosavid B Simonds, MD      . ipratropium-albuterol (DUONEB) 0.5-2.5 (3) MG/3ML nebulizer solution 3 mL  3 mL Nebulization Q6H Merwyn Katosavid B Simonds, MD   3 mL at 06/15/14 1511  . LORazepam (ATIVAN) injection 0.5-1 mg  0.5-1 mg Intravenous Q4H PRN Merwyn Katosavid B Simonds, MD      . pantoprazole (PROTONIX) injection  40 mg  40 mg Intravenous Q12H Merwyn Katosavid B Simonds, MD       Current Outpatient Prescriptions  Medication Sig Dispense Refill  . acetaminophen (TYLENOL) 325 MG tablet Place 2 tablets (650 mg total) into feeding tube every 6 (six) hours as needed for mild pain (or Fever >/= 101).    . Carboxymethylcellulose Sodium (REFRESH TEARS OP) Place 1 drop into both eyes every 6 (six) hours as needed (dry eyes).    . chlorhexidine (PERIDEX) 0.12 % solution Use as directed 15 mLs in the mouth or throat 2 (two) times daily.    Marland Kitchen. dexamethasone (DECADRON) 4 MG tablet 4 mg by PEG Tube route daily.    . diphenhydrAMINE (BENADRYL) 12.5 MG/5ML liquid Take 25 mg by mouth every 6 (six) hours as needed (reaction).    .Marland Kitchen  diphenhydrAMINE-zinc acetate (BENADRYL) cream Apply topically 3 (three) times daily as needed for itching. 28.4 g 0  . docusate (COLACE) 50 MG/5ML liquid 100 mg by PEG Tube route 2 (two) times daily.    . famotidine (PEPCID) 20 MG tablet 20 mg by PEG Tube route 2 (two) times daily.    . heparin 5000 UNIT/ML injection Inject 1 mL (5,000 Units total) into the skin every 8 (eight) hours. 1 mL   . insulin aspart (NOVOLOG) 100 UNIT/ML injection Inject 5-20 Units into the skin every 6 (six) hours as needed for high blood sugar. 200-300=5 units 301-400=10 units 401-500=15 units If greater than 500 give 20 units and call MD.    . insulin detemir (LEVEMIR) 100 UNIT/ML injection Inject 0.2 mLs (20 Units total) into the skin daily. (Patient taking differently: Inject 20-25 Units into the skin 2 (two) times daily. 20 units at 10am and 25 units at 9pm) 10 mL 11  . levalbuterol (XOPENEX) 1.25 MG/0.5ML nebulizer solution Take 1.25 mg by nebulization every 4 (four) hours as needed for wheezing or shortness of breath. 1 each 12  . Meropenem-Sodium Chloride 500 MG/50ML SOLR Inject 500 mg into the vein every 12 (twelve) hours.    . metroNIDAZOLE (FLAGYL) 500 MG tablet Take 500 mg by mouth 4 (four) times daily.    . Multiple  Vitamins-Minerals (MULTIVITAMIN WITH MINERALS) tablet 1 tablet by PEG Tube route daily.    . NON FORMULARY Enteral feed. If residuals are greater than hold feeding. Elevate head of bed 30-45 degrees during feeding and 30-60 mins after.    . Nutritional Supplements (FEEDING SUPPLEMENT, NEPRO CARB STEADY,) LIQD Place 1,000 mLs into feeding tube continuous.  0  . senna (SENOKOT) 8.6 MG tablet 1 tablet by PEG Tube route daily.    . vitamin C (ASCORBIC ACID) 500 MG tablet 500 mg by PEG Tube route daily.    Marland Kitchen acetylcysteine (MUCOMYST) 20 % nebulizer solution Take 3 mLs by nebulization 3 (three) times daily. 30 mL 12  . antiseptic oral rinse (CPC / CETYLPYRIDINIUM CHLORIDE 0.05%) 0.05 % LIQD solution 7 mLs by Mouth Rinse route QID.  0  . insulin detemir (LEVEMIR) 100 UNIT/ML injection Inject 0.1 mLs (10 Units total) into the skin at bedtime. (Patient not taking: Reported on 06/15/2014) 10 mL 11  . sodium chloride 0.9 % injection 10-40 mLs by Intracatheter route every 12 (twelve) hours. 5 mL 0  . sodium chloride 0.9 % injection 10-40 mLs by Intracatheter route as needed (flush). 5 mL 0  . sodium chloride 0.9 % injection Inject 3 mLs into the vein every 12 (twelve) hours. 5 mL 0  . Water For Irrigation, Sterile (FREE WATER) SOLN Place 250 mLs into feeding tube every 6 (six) hours.      Allergies as of 06/15/2014 - Review Complete 06/15/2014  Allergen Reaction Noted  . Sulfa antibiotics Other (See Comments) 05/06/2014  . Vancomycin Other (See Comments) 05/06/2014  . Vioxx [rofecoxib] Other (See Comments) 05/06/2014  . Zyvox [linezolid] Other (See Comments) 05/06/2014    History reviewed. No pertinent family history.  History   Social History  . Marital Status: Widowed    Spouse Name: N/A    Number of Children: N/A  . Years of Education: N/A   Occupational History  . Not on file.   Social History Main Topics  . Smoking status: Not on file  . Smokeless tobacco: Not on file  . Alcohol  Use: No  . Drug Use: Not  on file  . Sexual Activity: Not on file   Other Topics Concern  . Not on file   Social History Narrative    Review of Systems: Unable to provide Physical Exam: Vital signs in last 24 hours: Temp:  [97.8 F (36.6 C)] 97.8 F (36.6 C) (11/28 1115) Pulse Rate:  [54-100] 95 (11/28 1515) Resp:  [18-24] 20 (11/28 1515) BP: (83-146)/(41-85) 103/49 mmHg (11/28 1515) SpO2:  [95 %-100 %] 97 % (11/28 1515) FiO2 (%):  [35 %] 35 % (11/28 1514)   General:   Chronically Ill appearing  Head:  Normocephalic and atraumatic. Eyes:  Sclera clear, no icterus.   Conjunctiva pink. Ears:  Normal auditory acuity. Nose:  No deformity, discharge,  or lesions. Mouth:  No deformity or lesions, dentition normal. Neck:  Supple; no masses or thyromegaly. JVP not elevated Lungs:  Tracheostomy  Heart:  Regular rate and rhythm; no murmurs, clicks, rubs,  or gallops. Abdomen:  PEG Tube Msk:  Symmetrical without gross deformities. Normal posture. Pulses:  No carotid, renal, femoral bruits. DP and PT symmetrical and equal Extremities:  1+ pitting leg edema  Neurologic:  Unresponsive  Skin:  Sacral decubitus   Intake/Output from previous day:   Intake/Output this shift:    Lab Results:  Recent Labs  06/15/14 1140  WBC 17.0*  HGB 6.5*  HCT 20.1*  PLT 162   BMET  Recent Labs  06/15/14 1140  NA 123*  K 4.6  CL 85*  CO2 21  GLUCOSE 403*  BUN 177*  CREATININE 3.76*  CALCIUM 10.4   LFT No results for input(s): PROT, ALBUMIN, AST, ALT, ALKPHOS, BILITOT, BILIDIR, IBILI in the last 72 hours. PT/INR No results for input(s): LABPROT, INR in the last 72 hours. Hepatitis Panel No results for input(s): HEPBSAG, HCVAB, HEPAIGM, HEPBIGM in the last 72 hours.  Studies/Results: Dg Chest Portable 1 View  06/15/2014   ADDENDUM REPORT: 06/15/2014 15:27  ADDENDUM: The right PICC tip is in the right subclavian vein.   Electronically Signed   By: Gordan Payment M.D.   On:  06/15/2014 15:27   06/15/2014   CLINICAL DATA:  Renal failure, history of diabetes  EXAM: PORTABLE CHEST - 1 VIEW  COMPARISON:  05/14/2014  FINDINGS: Tracheostomy tube unchanged. Heart size and vascular pattern normal. Hyperinflation suggests COPD. Mild scarring or atelectasis in both midlung zones. Mild chronic bronchitic change. Twelve nodular opacity projects laterally over the left upper lobe not seen on the prior study. It measures about 29 x 59 mm. This is suboptimally evaluated due to numerous overlying tubes.  IMPRESSION: COPD. 6 x 3 cm opacity laterally left upper lobe. This could represent developing pneumonia. Recommend PA and lateral radiograph with attention to removing overlying tubes and leads.  Electronically Signed: By: Esperanza Heir M.D. On: 06/15/2014 12:46    Assessment/Plan: 1. Acute on Chronic renal failure with worsening azotemia. This could be due to progressive sepsis and hypotension. There appears to be no urgent indications for dialysis at this point and the foley catheter appears to be draining purulent urine. I discussed with the family Corrie Dandy and Clayborne Dana and informed them of the gravity of their father's condition and that dialysis in the long term or even short term should not be an option due to their father's advanced comorbid conditions and that a focus should be placed on making sure that he is comfortable and not suffering.  2. Azotemia The high BUN at 170 is multifactorial. Without a doubt the low  Hb suggests blood loss anemia and the high protein intake through the PEG tube feeding is contributing to large amounts of nitrogen in the gut. He is also hypercatabolic with steroids and also with sepsis. Treating the infection and weaning steroids would be appropriate.  I will defer further medical treatment to Dr Sung AmabileSimonds and the critical care team but would be happy to re consult if needed  LOS: 0 Anberlin Diez W @TODAY @3 :35 PM

## 2014-06-15 NOTE — ED Notes (Signed)
Dr Radford PaxBeaton aware of Critical HGB

## 2014-06-15 NOTE — ED Notes (Signed)
MD at bedside.PCCM

## 2014-06-15 NOTE — Progress Notes (Signed)
Patient came in via EMS on transport vent placed on home vent settings per MD patient tolerating vent settings well

## 2014-06-15 NOTE — ED Notes (Signed)
Pt here from [facility] (SNF area) for abnormal labs, pt is on trach/vent per norm on arrival. Reported to have elevated bun and creatinine level for three days. Pt is vent for copd.

## 2014-06-15 NOTE — ED Notes (Addendum)
Patient arrived from [facility], with Trach, foley, and picc in place. Peg tube and pressure ulcer

## 2014-06-15 NOTE — ED Provider Notes (Signed)
CSN: [id number]     Arrival date & time 06/15/14  1103 History   First MD Initiated Contact with Patient 06/15/14 1116     Chief Complaint  Patient presents with  . Abnormal Lab     HPI Pt here from [facility] (SNF area) for abnormal labs, pt is on trach/vent per norm on arrival. Reported to have elevated bun and creatinine level for three days. Pt is vent for copd. Past Medical History  Diagnosis Date  . COPD (chronic obstructive pulmonary disease)   . Anemia   . Renal disorder   . Dysphasia   . Tracheostomy dependent   . Diabetes mellitus without complication   . CHF (congestive heart failure)    History reviewed. No pertinent past surgical history. History reviewed. No pertinent family history. History  Substance Use Topics  . Smoking status: Not on file  . Smokeless tobacco: Not on file  . Alcohol Use: No    Review of Systems  Unable to perform ROS     Allergies  Sulfa antibiotics; Vancomycin; Vioxx; and Zyvox  Home Medications   Prior to Admission medications   Medication Sig Start Date End Date Taking? Authorizing Provider  acetaminophen (TYLENOL) 325 MG tablet Place 2 tablets (650 mg total) into feeding tube every 6 (six) hours as needed for mild pain (or Fever >/= 101). 05/15/14  Yes Russella Dar, NP  Carboxymethylcellulose Sodium (REFRESH TEARS OP) Place 1 drop into both eyes every 6 (six) hours as needed (dry eyes).   Yes Historical Provider, MD  chlorhexidine (PERIDEX) 0.12 % solution Use as directed 15 mLs in the mouth or throat 2 (two) times daily.   Yes Historical Provider, MD  dexamethasone (DECADRON) 4 MG tablet 4 mg by PEG Tube route daily.   Yes Historical Provider, MD  diphenhydrAMINE (BENADRYL) 12.5 MG/5ML liquid Take 25 mg by mouth every 6 (six) hours as needed (reaction).   Yes Historical Provider, MD  diphenhydrAMINE-zinc acetate (BENADRYL) cream Apply topically 3 (three) times daily as needed for itching. 05/15/14  Yes Russella Dar, NP   docusate (COLACE) 50 MG/5ML liquid 100 mg by PEG Tube route 2 (two) times daily.   Yes Historical Provider, MD  famotidine (PEPCID) 20 MG tablet 20 mg by PEG Tube route 2 (two) times daily.   Yes Historical Provider, MD  heparin 5000 UNIT/ML injection Inject 1 mL (5,000 Units total) into the skin every 8 (eight) hours. 05/15/14  Yes Russella Dar, NP  insulin aspart (NOVOLOG) 100 UNIT/ML injection Inject 5-20 Units into the skin every 6 (six) hours as needed for high blood sugar. 200-300=5 units 301-400=10 units 401-500=15 units If greater than 500 give 20 units and call MD.   Yes Historical Provider, MD  insulin detemir (LEVEMIR) 100 UNIT/ML injection Inject 0.2 mLs (20 Units total) into the skin daily. Patient taking differently: Inject 20-25 Units into the skin 2 (two) times daily. 20 units at 10am and 25 units at 9pm 05/15/14  Yes Russella Dar, NP  levalbuterol (XOPENEX) 1.25 MG/0.5ML nebulizer solution Take 1.25 mg by nebulization every 4 (four) hours as needed for wheezing or shortness of breath. 05/15/14  Yes Russella Dar, NP  Meropenem-Sodium Chloride 500 MG/50ML SOLR Inject 500 mg into the vein every 12 (twelve) hours.   Yes Historical Provider, MD  metroNIDAZOLE (FLAGYL) 500 MG tablet Take 500 mg by mouth 4 (four) times daily.   Yes Historical Provider, MD  Multiple Vitamins-Minerals (MULTIVITAMIN WITH MINERALS) tablet 1 tablet  by PEG Tube route daily.   Yes Historical Provider, MD  NON FORMULARY Enteral feed. If residuals are greater than 250ml hold feeding. Elevate head of bed 30-45 degrees during feeding and 30-60 mins after.   Yes Historical Provider, MD  Nutritional Supplements (FEEDING SUPPLEMENT, NEPRO CARB STEADY,) LIQD Place 1,000 mLs into feeding tube continuous. 05/15/14  Yes Russella DarAllison L Ellis, NP  senna (SENOKOT) 8.6 MG tablet 1 tablet by PEG Tube route daily.   Yes Historical Provider, MD  vitamin C (ASCORBIC ACID) 500 MG tablet 500 mg by PEG Tube route daily.   Yes  Historical Provider, MD  acetylcysteine (MUCOMYST) 20 % nebulizer solution Take 3 mLs by nebulization 3 (three) times daily. 05/15/14   Russella DarAllison L Ellis, NP  antiseptic oral rinse (CPC / CETYLPYRIDINIUM CHLORIDE 0.05%) 0.05 % LIQD solution 7 mLs by Mouth Rinse route QID. 05/15/14   Russella DarAllison L Ellis, NP  insulin detemir (LEVEMIR) 100 UNIT/ML injection Inject 0.1 mLs (10 Units total) into the skin at bedtime. Patient not taking: Reported on 06/15/2014 05/15/14   Russella DarAllison L Ellis, NP  sodium chloride 0.9 % injection 10-40 mLs by Intracatheter route every 12 (twelve) hours. 05/15/14   Russella DarAllison L Ellis, NP  sodium chloride 0.9 % injection 10-40 mLs by Intracatheter route as needed (flush). 05/15/14   Russella DarAllison L Ellis, NP  sodium chloride 0.9 % injection Inject 3 mLs into the vein every 12 (twelve) hours. 05/15/14   Russella DarAllison L Ellis, NP  Water For Irrigation, Sterile (FREE WATER) SOLN Place 250 mLs into feeding tube every 6 (six) hours. 05/15/14   Russella DarAllison L Ellis, NP   BP 107/39 mmHg  Pulse 71  Temp(Src) 97.6 F (36.4 C) (Oral)  Resp 22  Ht 5\' 4"  (1.626 m)  Wt 206 lb 12.7 oz (93.8 kg)  BMI 35.48 kg/m2  SpO2 100% Physical Exam  Constitutional: He appears well-developed and well-nourished. No distress. He is intubated.  HENT:  Head: Normocephalic and atraumatic.  Eyes: Conjunctivae are normal. Pupils are equal, round, and reactive to light.  Neck: Normal range of motion. No tracheal deviation present.  Cardiovascular: Normal rate and intact distal pulses.   Pulmonary/Chest: He is intubated. No respiratory distress.  Abdominal: Normal appearance. He exhibits no distension. There is no tenderness. There is no rebound.  Musculoskeletal: Normal range of motion.  Neurological: He is unresponsive. GCS eye subscore is 2. GCS verbal subscore is 1. GCS motor subscore is 3.  Skin: Skin is warm and dry. No rash noted.  Nursing note and vitals reviewed.   ED Course  Procedures (including critical care  time) CRITICAL CARE Performed by: Nelva NayBEATON,Jaydence Arnesen L Total critical care time: 30 min Critical care time was exclusive of separately billable procedures and treating other patients. Critical care was necessary to treat or prevent imminent or life-threatening deterioration. Critical care was time spent personally by me on the following activities: development of treatment plan with patient and/or surrogate as well as nursing, discussions with consultants, evaluation of patient's response to treatment, examination of patient, obtaining history from patient or surrogate, ordering and performing treatments and interventions, ordering and review of laboratory studies, ordering and review of radiographic studies, pulse oximetry and re-evaluation of patient's condition.  Labs Review Labs Reviewed  MRSA PCR SCREENING - Abnormal; Notable for the following:    MRSA by PCR POSITIVE (*)    All other components within normal limits  BASIC METABOLIC PANEL - Abnormal; Notable for the following:    Sodium 123 (*)  Chloride 85 (*)    Glucose, Bld 403 (*)    BUN 177 (*)    Creatinine, Ser 3.76 (*)    GFR calc non Af Amer 13 (*)    GFR calc Af Amer 15 (*)    Anion gap 17 (*)    All other components within normal limits  CBC WITH DIFFERENTIAL - Abnormal; Notable for the following:    WBC 17.0 (*)    RBC 2.50 (*)    Hemoglobin 6.5 (*)    HCT 20.1 (*)    Neutrophils Relative % 90 (*)    Lymphocytes Relative 5 (*)    Neutro Abs 15.2 (*)    All other components within normal limits  URINALYSIS, ROUTINE W REFLEX MICROSCOPIC - Abnormal; Notable for the following:    APPearance CLOUDY (*)    Glucose, UA 100 (*)    Hgb urine dipstick MODERATE (*)    Leukocytes, UA MODERATE (*)    All other components within normal limits  GLUCOSE, CAPILLARY - Abnormal; Notable for the following:    Glucose-Capillary 277 (*)    All other components within normal limits  BASIC METABOLIC PANEL - Abnormal; Notable for the  following:    Sodium 125 (*)    Chloride 85 (*)    Glucose, Bld 403 (*)    BUN 186 (*)    Creatinine, Ser 3.78 (*)    GFR calc non Af Amer 13 (*)    GFR calc Af Amer 15 (*)    Anion gap 21 (*)    All other components within normal limits  CBC - Abnormal; Notable for the following:    WBC 16.9 (*)    RBC 2.79 (*)    Hemoglobin 7.1 (*)    HCT 21.9 (*)    MCH 25.4 (*)    All other components within normal limits  GLUCOSE, CAPILLARY - Abnormal; Notable for the following:    Glucose-Capillary 311 (*)    All other components within normal limits  URINE MICROSCOPIC-ADD ON - Abnormal; Notable for the following:    Bacteria, UA MANY (*)    Casts HYALINE CASTS (*)    All other components within normal limits  GLUCOSE, CAPILLARY - Abnormal; Notable for the following:    Glucose-Capillary 372 (*)    All other components within normal limits  GLUCOSE, CAPILLARY - Abnormal; Notable for the following:    Glucose-Capillary 416 (*)    All other components within normal limits  GLUCOSE, CAPILLARY - Abnormal; Notable for the following:    Glucose-Capillary 397 (*)    All other components within normal limits  I-STAT ARTERIAL BLOOD GAS, ED - Abnormal; Notable for the following:    pH, Arterial 7.326 (*)    pO2, Arterial 112.0 (*)    Acid-base deficit 4.0 (*)    All other components within normal limits  I-STAT CG4 LACTIC ACID, ED - Abnormal; Notable for the following:    Lactic Acid, Venous 2.91 (*)    All other components within normal limits  CULTURE, RESPIRATORY (NON-EXPECTORATED)  CULTURE, BLOOD (ROUTINE X 2)  CULTURE, BLOOD (ROUTINE X 2)  OCCULT BLOOD X 1 CARD TO LAB, STOOL  PREPARE RBC (CROSSMATCH)  TYPE AND SCREEN    Imaging Review Dg Chest Portable 1 View  06/15/2014   ADDENDUM REPORT: 06/15/2014 15:27  ADDENDUM: The right PICC tip is in the right subclavian vein.   Electronically Signed   By: Gordan Payment M.D.   On: 06/15/2014 15:27  06/15/2014   CLINICAL DATA:  Renal  failure, history of diabetes  EXAM: PORTABLE CHEST - 1 VIEW  COMPARISON:  05/14/2014  FINDINGS: Tracheostomy tube unchanged. Heart size and vascular pattern normal. Hyperinflation suggests COPD. Mild scarring or atelectasis in both midlung zones. Mild chronic bronchitic change. Twelve nodular opacity projects laterally over the left upper lobe not seen on the prior study. It measures about 29 x 59 mm. This is suboptimally evaluated due to numerous overlying tubes.  IMPRESSION: COPD. 6 x 3 cm opacity laterally left upper lobe. This could represent developing pneumonia. Recommend PA and lateral radiograph with attention to removing overlying tubes and leads.  Electronically Signed: By: Esperanza Heiraymond  Rubner M.D. On: 06/15/2014 12:46     EKG Interpretation   Date/Time:  Saturday June 15 2014 11:11:31 EST Ventricular Rate:  95 PR Interval:  178 QRS Duration: 139 QT Interval:  360 QTC Calculation: 452 R Axis:   -97 Text Interpretation:  Sinus rhythm RBBB and LAFB  Lateral leads are also involved Confirmed by Benjimen Kelley  MD, Titan Karner ([id number]) on  06/15/2014 12:40:04 PM     This is a baseline mental status according to the family is at the bedside. MDM   Final diagnoses:  Renal failure  Hyponatremia Leukocytosis      Nelia Shiobert L Akima Slaugh, MD 06/16/14 0800

## 2014-06-15 NOTE — Consult Note (Signed)
Medical Consultation  Alcus DadForrester Patti QQV:[id number] DOB: 06-11-1923 DOA: 06/15/2014 PCP: Hillary BowROWLEY, MCKAY, MD   Requesting physician: Dr. Nelva Nayobert Beaton Date of consultation: 06/15/14 Reason for consultation: respiratory failure, renal failure  Impression/Recommendations Acute on chronic renal failure (CKD 3) -The patient had serum creatinine 2.37 on 05/15/2014 -Nephrology has been consulted -Judicious IV fluids -a combination of hemodynamics in the setting of infection and ATN as the patient has had intermittent hypotension Gapped Metabolic acidosis -likely multifactorial including possible DKA in the setting of worsening renal failure and lactic acidosis -Continue IV fluids -Urinalysis for ketones--if positive, the patient may need to be placed on insulin drip Chronic respiratory failure/COPD/ventilator dependence -The patient is vent dependent -He is not in any respiratory distress -Per the patient's family, the patient has actually been doing better from a respiratory standpoint at [facility] -Presently on PRVC, RR22, FiO2 35% -I have spoken with pulmonary critical care medicine.  They have kindly offered to take over the care of this patient Leukocytosis -Check UA and urine culture -The patient has been on meropenem at [facility] -may be due to patient's dexamethasone -WBC slightly better when compared to 05/15/2014 -I'm not convinced that the patient has VAP as pt is afebrile without increasing vent demand -Chest x-ray suggests left upper lobe opacity, but this actually has not changed when compared to previous chest x-ray from his last hospital admission Anemia of chronic disease and CKD -Based on hemoglobin 8-9 -Per the patient's family there been no reports of hematochezia or melena -FOBT -Transfuse PRBCs as needed Diabetes mellitus with palpitations of CKD - Hg A1c 7.4  -  Home regimen Levemir 20 units qAm, 10 unitsqPm  - may need IV insulin Severe PCM  /dysphagia - Chronic dysphagia preadmission in setting of chronic ventilator dependence - continue tube feeding per PEG  - Albumin 1.8 on 10/28   I spoke with critical care medicine, Dr. Sung AmabileSimonds.  He has kindly agreed to accept full care of this patient given the patient's complex medical conditions  Chief Complaint: Worsening renal failure  HPI:  80 year old male with a history of COPD, chronic respiratory failure, ventilator dependence, CKD stage IV who [facility] to [facility] on a ventilator. Because the patient was managed by the hospitalist service on his last admission, we were asked to admit. The patient is encephalopathic and is unable to provide history. All of this issue is obtained from review of the medical record, speaking with the patient's daughter. Notably, the patient's family wants the patient to remain full code at this time. Palliative medicine was consulted during the last admission; however, the patient's family wanted continued aggressive care. The patient was admitted for sepsis secondary to complicated UTI during his last admission. There is concerns for VAP, but infectious disease was consulted and did not feel that he needed to be started on antibiotics for VAP as to sputum cultures likely represented colonization.  The patient has a chronic indwelling Foley catheter that was changed possibly 3 days ago according to the patient's daughter. In addition the patient has a new right upper extremity PICC line that was placed possibly one week ago prior to this admission. According to the patient's daughter, there has not been any worsening respiratory distress. In fact, he has been doing better from a respiratory standpoint. There's been no reports of vomiting, uncontrolled pain, respiratory distress, or diarrhea. He has been tolerating his tube feeds. The patient normally receives bolus feeds every 6 hours. Most recently,  the patient was noted to  have increasing to be WBC and was started on meropenem and Flagyl at [facility].  His renal function continued to worsen over the past 3 days which prompted the patient to transfer to the emergency department.  In the emergency department, the patient was noted to have WBC 17,000, sodium 123, serum glucose 403, serum creatinine 2.76 with an anion gap of 17. Chest x-ray shows possible left upper lobe opacity. Blood gas revealed 7.32/41/112/23 on 35%. Hemoglobin was noted to be 6.5 with platelets 162,000.  Review of Systems:  Unobtainable center to patient's mental status  Past Medical History  Diagnosis Date  . COPD (chronic obstructive pulmonary disease)   . Anemia   . Renal disorder   . Dysphasia   . Tracheostomy dependent   . Diabetes mellitus without complication   . CHF (congestive heart failure)    History reviewed. No pertinent past surgical history. Social History:  reports that he does not drink alcohol. His tobacco and drug histories are not on file.  History reviewed. No pertinent family history.  Allergies  Allergen Reactions  . Sulfa Antibiotics Other (See Comments)    unknown  . Vancomycin Other (See Comments)    unknown  . Vioxx [Rofecoxib] Other (See Comments)    unknown  . Zyvox [Linezolid] Other (See Comments)    unknown     Prior to Admission medications   Medication Sig Start Date End Date Taking? Authorizing Provider  acetaminophen (TYLENOL) 325 MG tablet Place 2 tablets (650 mg total) into feeding tube every 6 (six) hours as needed for mild pain (or Fever >/= 101). 05/15/14  Yes Russella Dar, NP  Carboxymethylcellulose Sodium (REFRESH TEARS OP) Place 1 drop into both eyes every 6 (six) hours as needed (dry eyes).   Yes Historical Provider, MD  chlorhexidine (PERIDEX) 0.12 % solution Use as directed 15 mLs in the mouth or throat 2 (two) times daily.   Yes Historical Provider, MD  dexamethasone (DECADRON) 4 MG tablet 4 mg by PEG Tube route daily.   Yes  Historical Provider, MD  diphenhydrAMINE (BENADRYL) 12.5 MG/5ML liquid Take 25 mg by mouth every 6 (six) hours as needed (reaction).   Yes Historical Provider, MD  diphenhydrAMINE-zinc acetate (BENADRYL) cream Apply topically 3 (three) times daily as needed for itching. 05/15/14  Yes Russella Dar, NP  docusate (COLACE) 50 MG/5ML liquid 100 mg by PEG Tube route 2 (two) times daily.   Yes Historical Provider, MD  famotidine (PEPCID) 20 MG tablet 20 mg by PEG Tube route 2 (two) times daily.   Yes Historical Provider, MD  heparin 5000 UNIT/ML injection Inject 1 mL (5,000 Units total) into the skin every 8 (eight) hours. 05/15/14  Yes Russella Dar, NP  insulin aspart (NOVOLOG) 100 UNIT/ML injection Inject 5-20 Units into the skin every 6 (six) hours as needed for high blood sugar. 200-300=5 units 301-400=10 units 401-500=15 units If greater than 500 give 20 units and call MD.   Yes Historical Provider, MD  insulin detemir (LEVEMIR) 100 UNIT/ML injection Inject 0.2 mLs (20 Units total) into the skin daily. Patient taking differently: Inject 20-25 Units into the skin 2 (two) times daily. 20 units at 10am and 25 units at 9pm 05/15/14  Yes Russella Dar, NP  levalbuterol (XOPENEX) 1.25 MG/0.5ML nebulizer solution Take 1.25 mg by nebulization every 4 (four) hours as needed for wheezing or shortness of breath. 05/15/14  Yes Russella Dar, NP  Meropenem-Sodium Chloride 500  MG/50ML SOLR Inject 500 mg into the vein every 12 (twelve) hours.   Yes Historical Provider, MD  metroNIDAZOLE (FLAGYL) 500 MG tablet Take 500 mg by mouth 4 (four) times daily.   Yes Historical Provider, MD  Multiple Vitamins-Minerals (MULTIVITAMIN WITH MINERALS) tablet 1 tablet by PEG Tube route daily.   Yes Historical Provider, MD  NON FORMULARY Enteral feed. If residuals are greater than hold feeding. Elevate head of bed 30-45 degrees during feeding and 30-60 mins after.   Yes Historical Provider, MD  Nutritional  Supplements (FEEDING SUPPLEMENT, NEPRO CARB STEADY,) LIQD Place 1,000 mLs into feeding tube continuous. 05/15/14  Yes Russella Dar, NP  senna (SENOKOT) 8.6 MG tablet 1 tablet by PEG Tube route daily.   Yes Historical Provider, MD  vitamin C (ASCORBIC ACID) 500 MG tablet 500 mg by PEG Tube route daily.   Yes Historical Provider, MD  acetylcysteine (MUCOMYST) 20 % nebulizer solution Take 3 mLs by nebulization 3 (three) times daily. 05/15/14   Russella Dar, NP  antiseptic oral rinse (CPC / CETYLPYRIDINIUM CHLORIDE 0.05%) 0.05 % LIQD solution 7 mLs by Mouth Rinse route QID. 05/15/14   Russella Dar, NP  insulin detemir (LEVEMIR) 100 UNIT/ML injection Inject 0.1 mLs (10 Units total) into the skin at bedtime. Patient not taking: Reported on 06/15/2014 05/15/14   Russella Dar, NP  sodium chloride 0.9 % injection 10-40 mLs by Intracatheter route every 12 (twelve) hours. 05/15/14   Russella Dar, NP  sodium chloride 0.9 % injection 10-40 mLs by Intracatheter route as needed (flush). 05/15/14   Russella Dar, NP  sodium chloride 0.9 % injection Inject 3 mLs into the vein every 12 (twelve) hours. 05/15/14   Russella Dar, NP  Water For Irrigation, Sterile (FREE WATER) SOLN Place 250 mLs into feeding tube every 6 (six) hours. 05/15/14   Russella Dar, NP    Physical Exam: Filed Vitals:   06/15/14 1230 06/15/14 1245 06/15/14 1300 06/15/14 1315  BP: 143/85 110/54 92/59 83/46   Pulse: 54 100 98 99  Temp:      TempSrc:      Resp: 24 23 21 23   SpO2: 95% 100% 100% 100%   General:  Does not open eyes to verbal stimuli, NAD Head/Eye: No conjunctival hemorrhage, no icterus, Gerster/AT, No nystagmus ENT:  No icterus, tracheostomy in place without any erythema or drainage  Neck:  No masses, no lymphadenpathy CV:  RRR, no rub, no gallop, no S3 Lung:  Bibasilar crackles. No wheezing. Abdomen: soft/NT, +BS, nondistended, no peritoneal signs Ext: No cyanosis, No rashes, No petechiae, No lymphangitis,  2+ upper and lower extremity edema  Labs on Admission:  Basic Metabolic Panel:  Recent Labs Lab 06/15/14 1140  NA 123*  K 4.6  CL 85*  CO2 21  GLUCOSE 403*  BUN 177*  CREATININE 3.76*  CALCIUM 10.4   Liver Function Tests: No results for input(s): AST, ALT, ALKPHOS, BILITOT, PROT, ALBUMIN in the last 168 hours. No results for input(s): LIPASE, AMYLASE in the last 168 hours. No results for input(s): AMMONIA in the last 168 hours. CBC:  Recent Labs Lab 06/15/14 1140  WBC 17.0*  NEUTROABS 15.2*  HGB 6.5*  HCT 20.1*  MCV 80.4  PLT 162   Cardiac Enzymes: No results for input(s): CKTOTAL, CKMB, CKMBINDEX, TROPONINI in the last 168 hours. BNP: Invalid input(s): POCBNP CBG: No results for input(s): GLUCAP in the last 168 hours.  Radiological Exams on Admission: Dg Chest  Portable 1 View  06/15/2014   CLINICAL DATA:  Renal failure, history of diabetes  EXAM: PORTABLE CHEST - 1 VIEW  COMPARISON:  05/14/2014  FINDINGS: Tracheostomy tube unchanged. Heart size and vascular pattern normal. Hyperinflation suggests COPD. Mild scarring or atelectasis in both midlung zones. Mild chronic bronchitic change. Twelve nodular opacity projects laterally over the left upper lobe not seen on the prior study. It measures about 29 x 59 mm. This is suboptimally evaluated due to numerous overlying tubes.  IMPRESSION: COPD. 6 x 3 cm opacity laterally left upper lobe. This could represent developing pneumonia. Recommend PA and lateral radiograph with attention to removing overlying tubes and leads.   Electronically Signed   By: Esperanza Heiraymond  Rubner M.D.   On: 06/15/2014 12:46     Time spent: 65 min  Crystallynn Noorani [facility] Pager [phone]  If 7PM-7AM, please contact night-coverage [email or web address] Password [email or web address] 06/15/2014, 1:37 PM

## 2014-06-16 DIAGNOSIS — J9610 Chronic respiratory failure, unspecified whether with hypoxia or hypercapnia: Secondary | ICD-10-CM

## 2014-06-16 DIAGNOSIS — G9340 Encephalopathy, unspecified: Secondary | ICD-10-CM

## 2014-06-16 DIAGNOSIS — Z515 Encounter for palliative care: Secondary | ICD-10-CM

## 2014-06-16 LAB — CBC
HCT: 21.9 % — ABNORMAL LOW (ref 39.0–52.0)
HEMOGLOBIN: 7.1 g/dL — AB (ref 13.0–17.0)
MCH: 25.4 pg — ABNORMAL LOW (ref 26.0–34.0)
MCHC: 32.4 g/dL (ref 30.0–36.0)
MCV: 78.5 fL (ref 78.0–100.0)
Platelets: 151 10*3/uL (ref 150–400)
RBC: 2.79 MIL/uL — ABNORMAL LOW (ref 4.22–5.81)
RDW: 14.4 % (ref 11.5–15.5)
WBC: 16.9 10*3/uL — AB (ref 4.0–10.5)

## 2014-06-16 LAB — TYPE AND SCREEN
ABO/RH(D): B POS
ANTIBODY SCREEN: NEGATIVE
UNIT DIVISION: 0

## 2014-06-16 LAB — BASIC METABOLIC PANEL
ANION GAP: 21 — AB (ref 5–15)
BUN: 186 mg/dL — ABNORMAL HIGH (ref 6–23)
CHLORIDE: 85 meq/L — AB (ref 96–112)
CO2: 19 meq/L (ref 19–32)
Calcium: 9.7 mg/dL (ref 8.4–10.5)
Creatinine, Ser: 3.78 mg/dL — ABNORMAL HIGH (ref 0.50–1.35)
GFR calc Af Amer: 15 mL/min — ABNORMAL LOW (ref >90)
GFR calc non Af Amer: 13 mL/min — ABNORMAL LOW (ref >90)
Glucose, Bld: 403 mg/dL — ABNORMAL HIGH (ref 70–99)
POTASSIUM: 4.4 meq/L (ref 3.7–5.3)
SODIUM: 125 meq/L — AB (ref 137–147)

## 2014-06-16 LAB — GLUCOSE, CAPILLARY
GLUCOSE-CAPILLARY: 397 mg/dL — AB (ref 70–99)
GLUCOSE-CAPILLARY: 295 mg/dL — AB (ref 70–99)
GLUCOSE-CAPILLARY: 203 mg/dL — AB (ref 70–99)
Glucose-Capillary: 252 mg/dL — ABNORMAL HIGH (ref 70–99)
Glucose-Capillary: 285 mg/dL — ABNORMAL HIGH (ref 70–99)
Glucose-Capillary: 372 mg/dL — ABNORMAL HIGH (ref 70–99)
Glucose-Capillary: 416 mg/dL — ABNORMAL HIGH (ref 70–99)

## 2014-06-16 LAB — CBC WITH DIFFERENTIAL/PLATELET
BASOS ABS: 0 10*3/uL (ref 0.0–0.1)
BASOS PCT: 0 % (ref 0–1)
EOS ABS: 0 10*3/uL (ref 0.0–0.7)
Eosinophils Relative: 0 % (ref 0–5)
HCT: 20.1 % — ABNORMAL LOW (ref 39.0–52.0)
HEMOGLOBIN: 6.5 g/dL — AB (ref 13.0–17.0)
LYMPHS PCT: 5 % — AB (ref 12–46)
Lymphs Abs: 0.9 10*3/uL (ref 0.7–4.0)
MCH: 26 pg (ref 26.0–34.0)
MCHC: 32.3 g/dL (ref 30.0–36.0)
MCV: 80.4 fL (ref 78.0–100.0)
MONOS PCT: 5 % (ref 3–12)
Monocytes Absolute: 0.9 10*3/uL (ref 0.1–1.0)
NEUTROS PCT: 90 % — AB (ref 43–77)
Neutro Abs: 15.2 10*3/uL — ABNORMAL HIGH (ref 1.7–7.7)
Platelets: 162 10*3/uL (ref 150–400)
RBC: 2.5 MIL/uL — ABNORMAL LOW (ref 4.22–5.81)
RDW: 14.9 % (ref 11.5–15.5)
WBC: 17 10*3/uL — AB (ref 4.0–10.5)

## 2014-06-16 MED ORDER — DARBEPOETIN ALFA 25 MCG/0.42ML IJ SOSY
25.0000 ug | PREFILLED_SYRINGE | INTRAMUSCULAR | Status: DC
  Administered 2014-06-16: 25 ug via SUBCUTANEOUS
  Filled 2014-06-16: qty 0.42

## 2014-06-16 MED ORDER — INSULIN ASPART 100 UNIT/ML ~~LOC~~ SOLN
0.0000 [IU] | SUBCUTANEOUS | Status: DC
  Administered 2014-06-16: 8 [IU] via SUBCUTANEOUS
  Administered 2014-06-16: 5 [IU] via SUBCUTANEOUS
  Administered 2014-06-16 (×2): 8 [IU] via SUBCUTANEOUS
  Administered 2014-06-16: 15 [IU] via SUBCUTANEOUS
  Administered 2014-06-17 (×3): 5 [IU] via SUBCUTANEOUS
  Administered 2014-06-17: 3 [IU] via SUBCUTANEOUS
  Administered 2014-06-17: 8 [IU] via SUBCUTANEOUS

## 2014-06-16 MED ORDER — FERROUS SULFATE 220 (44 FE) MG/5ML PO ELIX
220.0000 mg | ORAL_SOLUTION | Freq: Two times a day (BID) | ORAL | Status: DC
  Filled 2014-06-16 (×2): qty 5

## 2014-06-16 MED ORDER — HYDROCORTISONE NA SUCCINATE PF 100 MG IJ SOLR
50.0000 mg | Freq: Two times a day (BID) | INTRAMUSCULAR | Status: DC
  Administered 2014-06-16 – 2014-06-17 (×2): 50 mg via INTRAVENOUS
  Filled 2014-06-16 (×4): qty 1

## 2014-06-16 MED ORDER — FERROUS SULFATE 300 (60 FE) MG/5ML PO SYRP
220.0000 mg | ORAL_SOLUTION | Freq: Two times a day (BID) | ORAL | Status: DC
  Administered 2014-06-16 – 2014-06-17 (×3): 220 mg
  Filled 2014-06-16 (×4): qty 5

## 2014-06-16 MED ORDER — PIPERACILLIN-TAZOBACTAM IN DEX 2-0.25 GM/50ML IV SOLN
2.2500 g | Freq: Four times a day (QID) | INTRAVENOUS | Status: DC
  Administered 2014-06-16 – 2014-06-17 (×4): 2.25 g via INTRAVENOUS
  Filled 2014-06-16 (×6): qty 50

## 2014-06-16 MED ORDER — JEVITY 1.2 CAL PO LIQD
1000.0000 mL | ORAL | Status: DC
  Administered 2014-06-16: 1000 mL
  Filled 2014-06-16 (×3): qty 1000

## 2014-06-16 MED ORDER — INSULIN GLARGINE 100 UNIT/ML ~~LOC~~ SOLN
20.0000 [IU] | Freq: Every day | SUBCUTANEOUS | Status: DC
  Administered 2014-06-16 – 2014-06-17 (×2): 20 [IU] via SUBCUTANEOUS
  Filled 2014-06-16 (×2): qty 0.2

## 2014-06-16 MED ORDER — INSULIN ASPART 100 UNIT/ML ~~LOC~~ SOLN
8.0000 [IU] | Freq: Once | SUBCUTANEOUS | Status: AC
  Administered 2014-06-16: 8 [IU] via INTRAVENOUS

## 2014-06-16 NOTE — Plan of Care (Signed)
Problem: Phase I Progression Outcomes Goal: VTE prophylaxis Outcome: Completed/Met Date Met:  06/16/14 Goal: GIProphysixis Outcome: Completed/Met Date Met:  06/16/14 Goal: Oral Care per Protocol Outcome: Completed/Met Date Met:  06/16/14 Goal: HOB elevated 30 degrees Outcome: Completed/Met Date Met:  06/16/14 Goal: VAP prevention protocol initiated Outcome: Completed/Met Date Met:  06/16/14 Goal: Sedation Protocol initiated if indicated Outcome: Not Applicable Date Met:  03/49/17 Goal: Pneumonia/flu vaccination screen completed Outcome: Completed/Met Date Met:  06/16/14 Goal: Initiate hyperglycemia protocol as indicated Outcome: Not Applicable Date Met:  91/50/56

## 2014-06-16 NOTE — Progress Notes (Addendum)
ANTIBIOTIC CONSULT NOTE - INITIAL  Pharmacy Consult for Vancomycin  Indication: bacteremia  Allergies  Allergen Reactions  . Sulfa Antibiotics Other (See Comments)    unknown  . Vancomycin Other (See Comments)    unknown  . Vioxx [Rofecoxib] Other (See Comments)    unknown  . Zyvox [Linezolid] Other (See Comments)    unknown    Patient Measurements: Height: 5\' 4"  (162.6 cm) Weight: 206 lb 12.7 oz (93.8 kg) IBW/kg (Calculated) : 59.2 Adjusted Body Weight: 75 kg  Vital Signs: Temp: 97.4 F (36.3 C) (11/29 1945) Temp Source: Oral (11/29 1945) BP: 121/49 mmHg (11/29 1900) Pulse Rate: 72 (11/29 1900) Intake/Output from previous day: 11/28 0701 - 11/29 0700 In: 1067.5 [I.V.:737.5; Blood:330] Out: 1175 [Urine:1175] Intake/Output from this shift:    Labs:  Recent Labs  06/15/14 1140 06/16/14 0200  WBC 17.0* 16.9*  HGB 6.5* 7.1*  PLT 162 151  CREATININE 3.76* 3.78*   Estimated Creatinine Clearance: 13.7 mL/min (by C-G formula based on Cr of 3.78). No results for input(s): VANCOTROUGH, VANCOPEAK, VANCORANDOM, GENTTROUGH, GENTPEAK, GENTRANDOM, TOBRATROUGH, TOBRAPEAK, TOBRARND, AMIKACINPEAK, AMIKACINTROU, AMIKACIN in the last 72 hours.   Microbiology: Recent Results (from the past 720 hour(s))  MRSA PCR Screening     Status: Abnormal   Collection Time: 06/15/14  3:54 PM  Result Value Ref Range Status   MRSA by PCR POSITIVE (A) NEGATIVE Final    Comment:        The GeneXpert MRSA Assay (FDA approved for NASAL specimens only), is one component of a comprehensive MRSA colonization surveillance program. It is not intended to diagnose MRSA infection nor to guide or monitor treatment for MRSA infections. RESULT CALLED TO, READ BACK BY AND VERIFIED WITH: SORCHA,D RN 06/15/14 1916 WOOTEN,K   Culture, blood (routine x 2)     Status: None (Preliminary result)   Collection Time: 06/15/14  4:40 PM  Result Value Ref Range Status   Specimen Description BLOOD LEFT HAND   Final   Special Requests BOTTLES DRAWN AEROBIC AND ANAEROBIC [facility]7CC  Final   Culture  Setup Time   Final    06/15/2014 21:18 Performed at [facility]    Culture   Final    GRAM NEGATIVE RODS Note: CRITICAL RESULT CALLED TO, READ BACK BY AND VERIFIED WITH: CARLA PORTER RN @755PM  VINCJ GRAM POSITIVE COCCI IN CLUSTERS Note: CRITICAL RESULT CALLED TO, READ BACK BY AND VERIFIED WITH: Jamesetta SoARLA PORTER RN @ [phone] [facility] Performed at [facility]    Report Status PENDING  Incomplete  Culture, blood (routine x 2)     Status: None (Preliminary result)   Collection Time: 06/15/14  4:50 PM  Result Value Ref Range Status   Specimen Description BLOOD LEFT ARM  Final   Special Requests BOTTLES DRAWN AEROBIC AND ANAEROBIC 10CC  Final   Culture  Setup Time   Final    06/15/2014 21:18 Performed at [facility]    Culture   Final    GRAM POSITIVE COCCI IN CLUSTERS Note: Gram Stain Report Called to,Read Back By and Verified With: Jamesetta Soarla Porter RN on 06/16/14 at 23:35 by Christie NottinghamAnne Skeen Performed at [facility]    Report Status PENDING  Incomplete  Culture, respiratory (NON-Expectorated)     Status: None (Preliminary result)   Collection Time: 06/15/14  5:21 PM  Result Value Ref Range Status   Specimen Description TRACHEAL ASPIRATE  Final   Special Requests NONE  Final   Gram Stain   Final  ABUNDANT WBC PRESENT, PREDOMINANTLY PMN RARE SQUAMOUS EPITHELIAL CELLS PRESENT FEW GRAM POSITIVE COCCI IN PAIRS RARE GRAM POSITIVE RODS Performed at [facility]    Culture   Final    Culture reincubated for better growth Performed at [facility]    Report Status PENDING  Incomplete    Medications:  Scheduled:  . sodium chloride   Intravenous Once  . antiseptic oral rinse  7 mL Mouth Rinse QID  . chlorhexidine  15 mL Mouth Rinse BID  . Darbepoetin Alfa  25 mcg Subcutaneous Q Sun  . ferrous sulfate  220 mg Per Tube Q12H  . hydrocortisone sod succinate  (SOLU-CORTEF) inj  50 mg Intravenous Q12H  . insulin aspart  0-15 Units Subcutaneous 6 times per day  . insulin glargine  20 Units Subcutaneous Daily  . ipratropium-albuterol  3 mL Nebulization Q6H  . pantoprazole (PROTONIX) IV  40 mg Intravenous Q12H  . piperacillin-tazobactam (ZOSYN)  IV  2.25 g Intravenous Q6H   Assessment: 80 yo male with 2/2 blood cultures growing GPC for empiric antibiotics.  Vancomycin allergy noted.  Per [facility] records, causes a rash  Goal of Therapy:  Vancomycin trough level 15-20 mcg/ml  Plan:  Vancomycin 1250 mg IV now, then 1 g IV q72h  Pretreat each dose with Benadryl 25 mg IV and infuse at 1/2 usual rate.  Eddie CandleAbbott, Tacori Kvamme Vernon 06/16/2014,11:46 PM

## 2014-06-16 NOTE — Progress Notes (Signed)
PULMONARY / CRITICAL CARE MEDICINE   Name: Shawn Lamb MRN: [id number] DOB: Feb 17, 1923    ADMISSION DATE:  06/15/2014  REFERRING MD :  [facility]  INITIAL PRESENTATION:  [age] M admitted via ED after transfer there from [facility] for eval of worsening renal function. He has ES lung disease and permanent VDRF with chronic kidney disease and history of MDR Klebsiellla. Also with Hgb 6.5 on admission. Family with unrealistic expectations  STUDIES/SIGNIFICANT EVENTS: 11/28 Renal consultation: deemed to not be a candidate for HD   SUBJECTIVE:  Unresponsive. No overt distress  VITAL SIGNS: Temp:  [97.4 F (36.3 C)-98.2 F (36.8 C)] 97.4 F (36.3 C) (11/29 1945) Pulse Rate:  [25-80] 72 (11/29 1900) Resp:  [15-24] 15 (11/29 1900) BP: (94-124)/(39-64) 121/49 mmHg (11/29 1900) SpO2:  [93 %-100 %] 99 % (11/29 1900) FiO2 (%):  [35 %] 35 % (11/29 1523) Weight:  [93.8 kg (206 lb 12.7 oz)] 93.8 kg (206 lb 12.7 oz) (11/29 0547) HEMODYNAMICS:   VENTILATOR SETTINGS: Vent Mode:  [-] PRVC FiO2 (%):  [35 %] 35 % Set Rate:  [16 bmp-22 bmp] 16 bmp Vt Set:  [550 mL] 550 mL PEEP:  [5 cmH20] 5 cmH20 Plateau Pressure:  [18 cmH20-20 cmH20] 20 cmH20 INTAKE / OUTPUT:  Intake/Output Summary (Last 24 hours) at 06/16/14 2022 Last data filed at 06/16/14 1900  Gross per 24 hour  Intake 826.33 ml  Output   1535 ml  Net -708.67 ml    PHYSICAL EXAMINATION: General:  Chronically ill appearing, NAD Neuro:  RASS -3, no spont movement HEENT:  WNL Cardiovascular: Reg, no M Lungs: no wheezes Abdomen:  Mildly distended, +BS, G tube in place Ext: + edema, symmetric  LABS:  CBC  Recent Labs Lab 06/15/14 1140 06/16/14 0200  WBC 17.0* 16.9*  HGB 6.5* 7.1*  HCT 20.1* 21.9*  PLT 162 151   Coag's No results for input(s): APTT, INR in the last 168 hours. BMET  Recent Labs Lab 06/15/14 1140 06/16/14 0200  NA 123* 125*  K 4.6 4.4  CL 85* 85*  CO2 21 19  BUN 177* 186*  CREATININE  3.76* 3.78*  GLUCOSE 403* 403*   Electrolytes  Recent Labs Lab 06/15/14 1140 06/16/14 0200  CALCIUM 10.4 9.7   Sepsis Markers  Recent Labs Lab 06/15/14 1209  LATICACIDVEN 2.91*   ABG  Recent Labs Lab 06/15/14 1145  PHART 7.326*  PCO2ART 41.4  PO2ART 112.0*   Liver Enzymes No results for input(s): AST, ALT, ALKPHOS, BILITOT, ALBUMIN in the last 168 hours. Cardiac Enzymes No results for input(s): TROPONINI, PROBNP in the last 168 hours. Glucose  Recent Labs Lab 06/16/14 0009 06/16/14 0256 06/16/14 0308 06/16/14 0824 06/16/14 1119 06/16/14 1603  GLUCAP 372* 416* 397* 285* 295* 252*    Imaging Dg Chest Portable 1 View  06/15/2014   ADDENDUM REPORT: 06/15/2014 15:27  ADDENDUM: The right PICC tip is in the right subclavian vein.   Electronically Signed   By: Gordan PaymentSteve  Reid M.D.   On: 06/15/2014 15:27   06/15/2014   CLINICAL DATA:  Renal failure, history of diabetes  EXAM: PORTABLE CHEST - 1 VIEW  COMPARISON:  05/14/2014  FINDINGS: Tracheostomy tube unchanged. Heart size and vascular pattern normal. Hyperinflation suggests COPD. Mild scarring or atelectasis in both midlung zones. Mild chronic bronchitic change. Twelve nodular opacity projects laterally over the left upper lobe not seen on the prior study. It measures about 29 x 59 mm. This is suboptimally evaluated due to numerous overlying  tubes.  IMPRESSION: COPD. 6 x 3 cm opacity laterally left upper lobe. This could represent developing pneumonia. Recommend PA and lateral radiograph with attention to removing overlying tubes and leads.  Electronically Signed: By: Esperanza Heiraymond  Rubner M.D. 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 GI losses Suspect component of anemia of renal failure P:  DVT px: SQ heparin Monitor CBC intermittently Transfuse per usual ICU guidelines Begin FeSO4 and Epogen 11/29  INFECTIOUS A:   Prior ESBL Klebsiella MRSA colonization Leukocytosis without obvious infection P:   MRSA PCR 11/28 >> POS resp 11/28 >>  Blood 11/28 >>  Monitoring off abx  ENDOCRINE A:   DM2 with hyperglycemia P:   Cont SSI  NEUROLOGIC A:  Advanced age Chronic severe debilitation Acute encephalopathy P:   RASS goal: -1 PRN sedation and analgesia as ordered Palliative Care consultation requested   FAMILY  - Updates:  No family @ bedside this AM     TODAY'S SUMMARY:  The family expressed very unrealistic goals and remarkable lack of insight in our discussions 11/28. The daughter expressed a hope that pt would be liberated from the vent and return to home. This is clearly not going to happen. She expressed a desire to donate him one of her kidneys, evidence of limited insight. Based on medical futility, he has been made DNR in the event of cardiac arrest. Dr Hyman HopesWebb has deemed pt to not be a candidate for HD of any duration, a decision that is strongly supported by PCCM. As such, we are providing no therapies or level of care that cannot be provided @ [facility]. [facility]   35 mins CCM time   Billy Fischeravid Tracina Beaumont, MD ; [facility] Mobile [phone].  After 5:30 PM or weekends, call [phone] Pulmonary and Critical Care Medicine [facility] Pager: [phone]  06/16/2014, 8:22 PM

## 2014-06-16 NOTE — Progress Notes (Signed)
Dear Doctor: This patient has been identified as a candidate for PICC for the following reason (s): IV therapy over 48 hours If you agree, please write an order for the indicated device. Current PICC malposition, found to be pulled back to subclavian vein. PICC is currently consider a midline.  If you want PICC to be centrally in SVC please order replacement.  Thanks For any questions contact the Vascular Access Team at [phone] if no answer, please leave a message.  Thank you for supporting the early vascular access assessment program.

## 2014-06-16 NOTE — Progress Notes (Signed)
eLink Physician-Brief Progress Note Patient Name: Shawn Lamb DOB: [birth year] MRN: [id number]   Date of Service  06/16/2014  HPI/Events of Note  Glu still up  eICU Interventions  IV reg insulin x 1 , repeat cbg in 1 hr then start mod ssi     Intervention Category Major Interventions: Hyperglycemia - active titration of insulin therapy  Nelda BucksFEINSTEIN,DANIEL J. 06/16/2014, 12:27 AM

## 2014-06-16 NOTE — Progress Notes (Signed)
ANTIBIOTIC CONSULT NOTE - INITIAL  Pharmacy Consult for zosyn Indication: pneumonia  Allergies  Allergen Reactions  . Sulfa Antibiotics Other (See Comments)    unknown  . Vancomycin Other (See Comments)    unknown  . Vioxx [Rofecoxib] Other (See Comments)    unknown  . Zyvox [Linezolid] Other (See Comments)    unknown    Patient Measurements: Height: 5\' 4"  (162.6 cm) Weight: 206 lb 12.7 oz (93.8 kg) IBW/kg (Calculated) : 59.2 Adjusted Body Weight:   Vital Signs: Temp: 97.4 F (36.3 C) (11/29 1945) Temp Source: Oral (11/29 1945) BP: 121/49 mmHg (11/29 1900) Pulse Rate: 72 (11/29 1900) Intake/Output from previous day: 11/28 0701 - 11/29 0700 In: 1067.5 [I.V.:737.5; Blood:330] Out: 1175 [Urine:1175] Intake/Output from this shift:    Labs:  Recent Labs  06/15/14 1140 06/16/14 0200  WBC 17.0* 16.9*  HGB 6.5* 7.1*  PLT 162 151  CREATININE 3.76* 3.78*   Estimated Creatinine Clearance: 13.7 mL/min (by C-G formula based on Cr of 3.78). No results for input(s): VANCOTROUGH, VANCOPEAK, VANCORANDOM, GENTTROUGH, GENTPEAK, GENTRANDOM, TOBRATROUGH, TOBRAPEAK, TOBRARND, AMIKACINPEAK, AMIKACINTROU, AMIKACIN in the last 72 hours.   Microbiology: Recent Results (from the past 720 hour(s))  MRSA PCR Screening     Status: Abnormal   Collection Time: 06/15/14  3:54 PM  Result Value Ref Range Status   MRSA by PCR POSITIVE (A) NEGATIVE Final    Comment:        The GeneXpert MRSA Assay (FDA approved for NASAL specimens only), is one component of a comprehensive MRSA colonization surveillance program. It is not intended to diagnose MRSA infection nor to guide or monitor treatment for MRSA infections. RESULT CALLED TO, READ BACK BY AND VERIFIED WITH: SORCHA,D RN 06/15/14 1916 WOOTEN,K   Culture, blood (routine x 2)     Status: None (Preliminary result)   Collection Time: 06/15/14  4:40 PM  Result Value Ref Range Status   Specimen Description BLOOD LEFT HAND  Final   Special Requests BOTTLES DRAWN AEROBIC AND ANAEROBIC [name]7CC  Final   Culture  Setup Time   Final    06/15/2014 21:18 Performed at [facility]    Culture   Final    GRAM NEGATIVE RODS Note: CRITICAL RESULT CALLED TO, READ BACK BY AND VERIFIED WITH: CARLA PORTER RN @755PM  VINCJ Performed at [facility]    Report Status PENDING  Incomplete  Culture, blood (routine x 2)     Status: None (Preliminary result)   Collection Time: 06/15/14  4:50 PM  Result Value Ref Range Status   Specimen Description BLOOD LEFT ARM  Final   Special Requests BOTTLES DRAWN AEROBIC AND ANAEROBIC 10CC  Final   Culture  Setup Time   Final    06/15/2014 21:18 Performed at [facility]    Culture   Final           BLOOD CULTURE RECEIVED NO GROWTH TO DATE CULTURE WILL BE HELD FOR 5 DAYS BEFORE ISSUING A FINAL NEGATIVE REPORT Performed at [facility]    Report Status PENDING  Incomplete  Culture, respiratory (NON-Expectorated)     Status: None (Preliminary result)   Collection Time: 06/15/14  5:21 PM  Result Value Ref Range Status   Specimen Description TRACHEAL ASPIRATE  Final   Special Requests NONE  Final   Gram Stain   Final    ABUNDANT WBC PRESENT, PREDOMINANTLY PMN RARE SQUAMOUS EPITHELIAL CELLS PRESENT FEW GRAM POSITIVE COCCI IN PAIRS RARE GRAM POSITIVE RODS [facility]formed  [facility]    Culture   Final    Culture reincubated for better growth Performed at [facility]    Report Status PENDING  Incomplete    Medical History: Past Medical History  Diagnosis Date  . COPD (chronic obstructive pulmonary disease)   . Anemia   . Renal disorder   . Dysphasia   . Tracheostomy dependent   . Diabetes mellitus without complication   . CHF (congestive heart failure)     Medications:  Anti-infectives    Start     Dose/Rate Route Frequency Ordered Stop   06/16/14 2100  piperacillin-tazobactam (ZOSYN) IVPB 2.25 g     2.25 g100 mL/hr over 30 Minutes  Intravenous Every 6 hours 06/16/14 2017       Assessment: [age] yom presented with AMS and acute on chronic kidney disease from [facility]. To start zosyn for GNR in 1/2 blood cultures. Pt is currently afebrile and WBC 16.9. Scr 3.78. Of note, pt with history of MDR organisms which zosyn may not cover. MD aware and planning on waiting on sensitivities for further escalation of antibiotics.   Zosyn 11/29>>  Goal of Therapy:  Eradication of infection  Plan:  1. Zosyn 2.25gm IV Q6H 2. F/u renal fxn, C&S, clinical status   Sumer Moorehouse, Drake Leachachel Lynn 06/16/2014,8:18 PM

## 2014-06-16 NOTE — Progress Notes (Signed)
Sung AmabileSimonds, MD made aware of communication with IV team of PICC line position on X-ray, he left nursing order that it is OK to use PICC line in current position.

## 2014-06-16 NOTE — Progress Notes (Signed)
eLink Physician-Brief Progress Note Patient Name: Alcus DadForrester Persons DOB: [birth year] MRN: [id number]   Date of Service  06/16/2014  HPI/Events of Note  Positive blood cultures called to me with GNR.  Last hospital stay had MDR Klebsiella and serratia.  The klebsiella was resistant to all abx with [facility]med sens to [facility].  eICU Interventions  Start Zosyn for now and wait for final ID and sens     Intervention Category Intermediate Interventions: Infection - evaluation and management  Henry RusselSMITH, Kingdom Vanzanten, P 06/16/2014, 8:08 PM

## 2014-06-16 NOTE — Consult Note (Signed)
Patient Shawn Lamb      DOB: [birth year]      JJK:[id number]     Consult Note from the Palliative Medicine Team at [place] Requested by: Dr Alva Garnet     PCP: Verneita Griffes, MD Reason for Consultation:Goals of Care     Phone Number:[phone]  Assessment/Recommendations: 80 yo male with COPD, vent dependent resp failure, CKD, MDR infections who presented with AKI and altered mental status  1.  Code Status: DNR per conversation with Dr Alva Garnet.  I agree that performing ACLS would be potentially inappropriate care.  If family challenges this, would recommend ethics consultation.    2 Goals of Care: Difficult situation. Year ago, patient was living independently.  Daughters Shawn Lamb and Shawn Lamb with thought of their dad dying.  Even mentions of concerns about his health declining are met with optimism (especially by [facility]) and certainty that he is "going to get better".  In discussing his situation, they frequently interrupt.  They show me pictures and videos of how "well" he had been doing at [facility] (working with PT, eating by mouth).  We talked about difficulties in achieving sustained recovery given his age, functional, decline recurrent infections,etc and that it is not surprising to see setbacks.  Shawn Lamb is adamant that she will be able to take him home off the ventilator and that he will do well once he is over these things. Family rally's around fighter mentality.  Tried to get them to focus on multifactorial nature of his situation rather than focus on one thing. Not sure how successful this was.  Initially I mistakenly told them he is on abx, but clarified this later with Shawn.  Talked about potenital for in-hospital decline, especially from renal standpoint. They have clearly had multiple conversations about his poor prognosis in past and admit they do not like having these meetings with physicians. It is clear that any "rehab" progress he makes is  met with decline and setbacks.     Will follow-up with them tomorrow.     3. Symptom Management:   1. Acute Encephalopathy- multifactorial.  AKI/BUN elevation, ?blood loss anemia, ?MDR infection. Agree that dialysis would be potentially inappropriate care.    4. Psychosocial/Spiritual:  All his siblings have passed away. Wife passed away in 20-Oct-1982.  2 daughters Shawn Lamb and Shawn Lamb. Was living in [place] prior to admission to hospitals, trach/vent, [facility].  Originally from [place], [place] area.     Brief HPI: 80 yo male with PMHx of COPD, CKD, vent dependent respiratory failure, MDR infections who presented from [facility] with altered mental status and AKI.  Recently started on Meropenem/Flagyl for potential infection but worsening mentation, renal function led to transfer here.  He is unable to provide any history.   Daughters report that he was living independently and still driving 1 year ago.  Admitted for COPD in [facility] which lead to rehab stay.  Rehab stay complicated by resp failure leading to hospitalization at [facility] where trach was placed (?April).  They report he has been at [facility] here since August. He was hospitalized last month for sepsis 2/2 MDR UTI.     ROS: Unable to obtain with AMS and vent dependence    PMH:  Past Medical History  Diagnosis Date  . COPD (chronic obstructive pulmonary disease)   . Anemia   . Renal disorder   . Dysphasia   . Tracheostomy dependent   . Diabetes mellitus  without complication   . CHF (congestive heart failure)      KXN:YIAXBPE reviewed. No pertinent past surgical history. I have reviewed the FH and SH and  If appropriate update it with new information. Allergies  Allergen Reactions  . Sulfa Antibiotics Other (See Comments)    unknown  . Vancomycin Other (See Comments)    unknown  . Vioxx [Rofecoxib] Other (See Comments)    unknown  . Zyvox [Linezolid] Other (See Comments)    unknown   Scheduled Meds: . sodium  chloride   Intravenous Once  . antiseptic oral rinse  7 mL Mouth Rinse QID  . chlorhexidine  15 mL Mouth Rinse BID  . Darbepoetin Alfa  25 mcg Subcutaneous Q Sun  . ferrous sulfate  220 mg Per Tube Q12H  . hydrocortisone sod succinate (SOLU-CORTEF) inj  50 mg Intravenous Q12H  . insulin aspart  0-15 Units Subcutaneous 6 times per day  . insulin glargine  20 Units Subcutaneous Daily  . ipratropium-albuterol  3 mL Nebulization Q6H  . pantoprazole (PROTONIX) IV  40 mg Intravenous Q12H   Continuous Infusions: . feeding supplement (JEVITY 1.2 CAL)     PRN Meds:.sodium chloride, acetaminophen (TYLENOL) oral liquid 160 mg/5 mL, fentaNYL, LORazepam    BP 104/42 mmHg  Pulse 73  Temp(Src) 97.7 F (36.5 C) (Axillary)  Resp 16  Ht 5\' 4"  (1.626 m)  Wt 93.8 kg (206 lb 12.7 oz)  BMI 35.48 kg/m2  SpO2 99%   PPS: 10   Intake/Output Summary (Last 24 hours) at 06/16/14 1228 Last data filed at 06/16/14 06/18/14  Gross per 24 hour  Intake 1167.5 ml  Output   1435 ml  Net -267.5 ml    Physical Exam:  General: Minimally responsive, NAD HEENT:  [place], sclera anicteric Chest:   CTAB, symm exp CVS: RRR Abdomen: soft, ND Ext: edema Neuro: does not follow commands Skin: exfoliative skin lesion  Labs: CBC    Component Value Date/Time   WBC 16.9* 06/16/2014 0200   RBC 2.79* 06/16/2014 0200   RBC 2.65* 05/07/2014 0213   HGB 7.1* 06/16/2014 0200   HCT 21.9* 06/16/2014 0200   PLT 151 06/16/2014 0200   MCV 78.5 06/16/2014 0200   MCH 25.4* 06/16/2014 0200   MCHC 32.4 06/16/2014 0200   RDW 14.4 06/16/2014 0200   LYMPHSABS 0.9 06/15/2014 1140   MONOABS 0.9 06/15/2014 1140   EOSABS 0.0 06/15/2014 1140   BASOSABS 0.0 06/15/2014 1140    BMET    Component Value Date/Time   NA 125* 06/16/2014 0200   K 4.4 06/16/2014 0200   CL 85* 06/16/2014 0200   CO2 19 06/16/2014 0200   GLUCOSE 403* 06/16/2014 0200   BUN 186* 06/16/2014 0200   CREATININE 3.78* 06/16/2014 0200   CALCIUM 9.7 06/16/2014  0200   GFRNONAA 13* 06/16/2014 0200   GFRAA 15* 06/16/2014 0200    CMP     Component Value Date/Time   NA 125* 06/16/2014 0200   K 4.4 06/16/2014 0200   CL 85* 06/16/2014 0200   CO2 19 06/16/2014 0200   GLUCOSE 403* 06/16/2014 0200   BUN 186* 06/16/2014 0200   CREATININE 3.78* 06/16/2014 0200   CALCIUM 9.7 06/16/2014 0200   PROT 6.6 05/15/2014 0500   ALBUMIN 1.8* 05/15/2014 0500   AST 9 05/15/2014 0500   ALT 13 05/15/2014 0500   ALKPHOS 114 05/15/2014 0500   BILITOT 0.2* 05/15/2014 0500   GFRNONAA 13* 06/16/2014 0200   GFRAA 15* 06/16/2014 0200  11/28 CXR IMPRESSION: COPD. 6 x 3 cm opacity laterally left upper lobe. This could represent developing pneumonia. Recommend PA and lateral radiograph with attention to removing overlying tubes and leads.   Total Time: 90 minutes Greater than 50%  of this time was spent counseling and coordinating care related to the above assessment and plan, review of records, meeting with family.   Doran Clay D.O. Palliative Medicine Team at [facility]  Pager: [phone] Team Phone: [phone]

## 2014-06-17 ENCOUNTER — Encounter (HOSPITAL_COMMUNITY): Payer: Self-pay | Admitting: *Deleted

## 2014-06-17 LAB — GLUCOSE, CAPILLARY
GLUCOSE-CAPILLARY: 220 mg/dL — AB (ref 70–99)
GLUCOSE-CAPILLARY: 250 mg/dL — AB (ref 70–99)
Glucose-Capillary: 190 mg/dL — ABNORMAL HIGH (ref 70–99)
Glucose-Capillary: 203 mg/dL — ABNORMAL HIGH (ref 70–99)
Glucose-Capillary: 211 mg/dL — ABNORMAL HIGH (ref 70–99)
Glucose-Capillary: 255 mg/dL — ABNORMAL HIGH (ref 70–99)

## 2014-06-17 MED ORDER — DIPHENHYDRAMINE HCL 50 MG/ML IJ SOLN: 25.0000 mg | INTRAMUSCULAR | Status: DC

## 2014-06-17 MED ORDER — VANCOMYCIN HCL IN DEXTROSE 1-5 GM/200ML-% IV SOLN: 1000.0000 mg | INTRAVENOUS | Status: AC

## 2014-06-17 MED ORDER — VANCOMYCIN HCL IN DEXTROSE 1-5 GM/200ML-% IV SOLN: 1000.0000 mg | INTRAVENOUS | Status: DC

## 2014-06-17 MED ORDER — PNEUMOCOCCAL VAC POLYVALENT 25 MCG/0.5ML IJ INJ: 0.5000 mL | INJECTION | INTRAMUSCULAR | Status: DC

## 2014-06-17 MED ORDER — INFLUENZA VAC SPLIT QUAD 0.5 ML IM SUSY: 0.5000 mL | PREFILLED_SYRINGE | INTRAMUSCULAR | Status: DC

## 2014-06-17 MED ORDER — DARBEPOETIN ALFA 25 MCG/0.42ML IJ SOSY: 25.0000 ug | PREFILLED_SYRINGE | INTRAMUSCULAR | Status: AC

## 2014-06-17 MED ORDER — FERROUS SULFATE 300 (60 FE) MG/5ML PO SYRP: 220.0000 mg | ORAL_SOLUTION | Freq: Two times a day (BID) | ORAL | Status: AC

## 2014-06-17 MED ORDER — PIPERACILLIN-TAZOBACTAM IN DEX 2-0.25 GM/50ML IV SOLN: 2.2500 g | Freq: Four times a day (QID) | INTRAVENOUS | Status: AC

## 2014-06-17 MED ORDER — DIPHENHYDRAMINE HCL 50 MG/ML IJ SOLN
25.0000 mg | Freq: Once | INTRAMUSCULAR | Status: AC
  Administered 2014-06-17: 25 mg via INTRAVENOUS
  Filled 2014-06-17: qty 1

## 2014-06-17 MED ORDER — VANCOMYCIN HCL 10 G IV SOLR
1250.0000 mg | Freq: Once | INTRAVENOUS | Status: AC
  Administered 2014-06-17: 1250 mg via INTRAVENOUS
  Filled 2014-06-17: qty 1250

## 2014-06-17 MED ORDER — MUPIROCIN 2 % EX OINT
1.0000 "application " | TOPICAL_OINTMENT | Freq: Two times a day (BID) | CUTANEOUS | Status: DC
  Administered 2014-06-17: 1 via NASAL
  Filled 2014-06-17: qty 22

## 2014-06-17 MED ORDER — CHLORHEXIDINE GLUCONATE CLOTH 2 % EX PADS: 6.0000 | MEDICATED_PAD | Freq: Every day | CUTANEOUS | Status: DC

## 2014-06-17 NOTE — Plan of Care (Signed)
Problem: Phase I Progression Outcomes Goal: Progressing towards optiumm acitivities Outcome: Not Applicable Date Met:  30/05/11

## 2014-06-17 NOTE — Progress Notes (Signed)
CRITICAL VALUE ALERT  Critical value received:  Aerobic bottle + blood culture one set G- Rods  Date of notification:  06-16-14  Time of notification:  1955  Critical value read back:Yes.    Nurse who received alert:  Jamesetta Soarla Tashima Scarpulla RN  MD notified (1st page): Shawn Lamb  Time of first page:  2005  Time MD responded:  2005

## 2014-06-17 NOTE — Progress Notes (Signed)
CRITICAL VALUE ALERT  Critical value received:  Anaerobic bottle positive blood cultures both sets G+ cocci in clusters  Date of notification:  06-16-14  Time of notification:  One set called at 2038 and second set called at  2335  Critical value read back:Yes.    Nurse who received alert:  Jamesetta Soarla Rogue Pautler RN  MD notified (1st page):  Shelba FlakeElizabeth Deterding  Time of first page: notified both sets positive at 2340  Responding MD:  Shelba FlakeElizabeth Deterding  Time MD responded:  [phone]

## 2014-06-17 NOTE — Discharge Summary (Addendum)
Physician Discharge Summary  Patient ID: Shawn Lamb MRN: [id number] DOB/AGE: 02/25/1923 80 y.o.  Admit date: 06/15/2014 Discharge date: 06/17/2014  Problem List Active Problems:   Tracheostomy dependent   Acute on chronic renal failure   Hyponatremia   Chronic respiratory failure   Anemia of chronic disease   DM (diabetes mellitus), type 2 with renal complications   Acute blood loss anemia  HPI: 80 year old male with a history of COPD, chronic respiratory failure, ventilator dependence, CKD stage IV who [facility] to [facility] on a ventilator.The patient was admitted for sepsis secondary to complicated UTI during his last admission. The patient has a chronic indwelling Foley catheter that was changed possibly 3 days ago prior to admit, according to the patient's daughter. In addition the patient has a new right upper extremity PICC line that was placed possibly one week ago prior to this admission. According to the patient's daughter, there has not been any worsening respiratory distress. In fact, he has been doing better from a respiratory standpoint. There's been no reports of vomiting, uncontrolled pain, respiratory distress, or diarrhea. He has been tolerating his tube feeds. The patient normally receives bolus feeds every 6 hours. Most recently, the patient was noted to have increasing to be WBC and was started on meropenem and Flagyl at [facility]. His renal function continued to worsen over the past 3 days which prompted the patient to transfer to the emergency department. In the emergency department, the patient was noted to have WBC 17,000, sodium 123, serum glucose 403, serum creatinine 2.76 with an anion gap of 17. Chest x-ray shows possible left upper lobe opacity. Blood gas revealed 7.32/41/112/23 on 35%. Hemoglobin was noted to be 6.5 with platelets 162,000. He was initially seen by IM service but given his multiple medical issues PCCM asked to  admit   Hospital Course: STUDIES/SIGNIFICANT EVENTS: 11/28 Renal consultation: deemed to not be a candidate for HD 11/29 Palliative care consult >> family expressed hope he can get better, and wanted to continue all aggressive measures 11/30 return to [facility].  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 P:  Monitor BMET intermittently  GASTROINTESTINAL A:  Chronic G tube P:  Tube feeds Continue SUP  HEMATOLOGIC A:  Anemia without overt bleeding - Hb stable >> Suspect component of anemia of renal failure P:  Monitor CBC intermittently Begin FeSO4 and Epogen 11/29  INFECTIOUS A:  Prior ESBL Klebsiella. MRSA colonization. Sepsis with GPC bacteremia. P:  MRSA PCR 11/28 >> POS resp 11/28 >>  Blood 11/28 >>GPC clusters>>  11/29 zoysn>> 11/29 vanco>>  ENDOCRINE A:  DM2 with hyperglycemia on chronic steroids. P:  SSI  NEUROLOGIC A:  Chronic severe debilitation Acute encephalopathy. P:  Monitor mental status    Labs at discharge Lab Results  Component Value Date   CREATININE 3.78* 06/16/2014   BUN 186* 06/16/2014   NA 125* 06/16/2014   K 4.4 06/16/2014   CL 85* 06/16/2014   CO2 19 06/16/2014   Lab Results  Component Value Date   WBC 16.9* 06/16/2014   HGB 7.1* 06/16/2014   HCT 21.9* 06/16/2014  MCV 78.5 06/16/2014   PLT 151 06/16/2014   Lab Results  Component Value Date   ALT 13 05/15/2014   AST 9 05/15/2014   ALKPHOS 114 05/15/2014   BILITOT 0.2* 05/15/2014   Lab Results  Component Value Date   INR 1.16 05/07/2014    Current radiology studies Dg  Chest Portable 1 View  06/15/2014   ADDENDUM REPORT: 06/15/2014 15:27  ADDENDUM: The right PICC tip is in the right subclavian vein.   Electronically Signed   By: Gordan PaymentSteve  Reid M.D.   On: 06/15/2014 15:27   06/15/2014   CLINICAL DATA:  Renal failure, history of diabetes  EXAM: PORTABLE CHEST - 1 VIEW  COMPARISON:  05/14/2014  FINDINGS: Tracheostomy tube unchanged. Heart size and vascular pattern normal. Hyperinflation suggests COPD. Mild scarring or atelectasis in both midlung zones. Mild chronic bronchitic change. Twelve nodular opacity projects laterally over the left upper lobe not seen on the prior study. It measures about 29 x 59 mm. This is suboptimally evaluated due to numerous overlying tubes.  IMPRESSION: COPD. 6 x 3 cm opacity laterally left upper lobe. This could represent developing pneumonia. Recommend PA and lateral radiograph with attention to removing overlying tubes and leads.  Electronically Signed: By: Esperanza Heiraymond  Rubner M.D. On: 06/15/2014 12:46    Disposition:  [facility]  Discharge Instructions    Discharge to SNF when bed available    Complete by:  As directed             Medication List    STOP taking these medications        Meropenem-Sodium Chloride 500 MG/50ML Solr     metroNIDAZOLE 500 MG tablet  Commonly known as:  FLAGYL      TAKE these medications        acetaminophen 325 MG tablet  Commonly known as:  TYLENOL  Place 2 tablets (650 mg total) into feeding tube every 6 (six) hours as needed for mild pain (or Fever >/= 101).     acetylcysteine 20 % nebulizer solution  Commonly known as:  MUCOMYST  Take 3 mLs by nebulization 3 (three) times daily.     antiseptic oral rinse 0.05 % Liqd solution  Commonly known as:  CPC / CETYLPYRIDINIUM CHLORIDE 0.05%  7 mLs by Mouth Rinse route QID.     chlorhexidine 0.12 % solution  Commonly known as:  PERIDEX  Use as directed 15 mLs in the mouth or throat 2 (two) times daily.     Darbepoetin Alfa 25  MCG/0.42ML Sosy injection  Commonly known as:  ARANESP  Inject 0.42 mLs (25 mcg total) into the skin every Sunday.     dexamethasone 4 MG tablet  Commonly known as:  DECADRON  4 mg by PEG Tube route daily.     diphenhydrAMINE 12.5 MG/5ML liquid  Commonly known as:  BENADRYL  Take 25 mg by mouth every 6 (six) hours as needed (reaction).     diphenhydrAMINE-zinc acetate cream  Commonly known as:  BENADRYL  Apply topically 3 (three) times daily as needed for itching.     docusate 50 MG/5ML liquid  Commonly known as:  COLACE  100 mg by PEG Tube route 2 (two) times daily.     famotidine 20 MG tablet  Commonly known as:  PEPCID  20 mg by PEG Tube route 2 (two) times daily.     feeding supplement (NEPRO CARB STEADY) Liqd  Place 1,000 mLs into feeding tube continuous.  ferrous sulfate 300 (60 FE) MG/5ML syrup  Place 3.7 mLs (220 mg total) into feeding tube every 12 (twelve) hours.     free water Soln  Place 250 mLs into feeding tube every 6 (six) hours.     heparin 5000 UNIT/ML injection  Inject 1 mL (5,000 Units total) into the skin every 8 (eight) hours.     insulin aspart 100 UNIT/ML injection  Commonly known as:  novoLOG  - Inject 5-20 Units into the skin every 6 (six) hours as needed for high blood sugar. 200-300=5 units  - 301-400=10 units  - 401-500=15 units  - If greater than 500 give 20 units and call MD.     insulin detemir 100 UNIT/ML injection  Commonly known as:  LEVEMIR  Inject 0.1 mLs (10 Units total) into the skin at bedtime.     insulin detemir 100 UNIT/ML injection  Commonly known as:  LEVEMIR  Inject 0.2 mLs (20 Units total) into the skin daily.     levalbuterol 1.25 MG/0.5ML nebulizer solution  Commonly known as:  XOPENEX  Take 1.25 mg by nebulization every 4 (four) hours as needed for wheezing or shortness of breath.     multivitamin with minerals tablet  1 tablet by PEG Tube route daily.     NON FORMULARY  - Enteral feed. If residuals are  greater than hold feeding.  - Elevate head of bed 30-45 degrees during feeding and 30-60 mins after.     piperacillin-tazobactam 2-0.25 GM/50ML IVPB  Commonly known as:  ZOSYN  Inject 50 mLs (2.25 g total) into the vein every 6 (six) hours.     REFRESH TEARS OP  Place 1 drop into both eyes every 6 (six) hours as needed (dry eyes).     senna 8.6 MG tablet  Commonly known as:  SENOKOT  1 tablet by PEG Tube route daily.     sodium chloride 0.9 % injection  10-40 mLs by Intracatheter route every 12 (twelve) hours.     sodium chloride 0.9 % injection  10-40 mLs by Intracatheter route as needed (flush).     sodium chloride 0.9 % injection  Inject 3 mLs into the vein every 12 (twelve) hours.     vancomycin 1 GM/200ML Soln  Commonly known as:  VANCOCIN  Inject 200 mLs (1,000 mg total) into the vein every other day.  Start taking on:  06/19/2014     vitamin C 500 MG tablet  Commonly known as:  ASCORBIC ACID  500 mg by PEG Tube route daily.          Discharged Condition: poor  Time spent on discharge greater than 40 minutes.  Vital signs at Discharge. Temp:  [97 F (36.1 C)-97.4 F (36.3 C)] 97.2 F (36.2 C) (11/30 1154) Pulse Rate:  [62-104] 77 (11/30 1100) Resp:  [15-24] 19 (11/30 1100) BP: (95-127)/(36-83) 127/83 mmHg (11/30 1100) SpO2:  [93 %-100 %] 96 % (11/30 1100) FiO2 (%):  [35 %] 35 % (11/30 1610) Office follow up Special Information or instructions. Per [facility]  Signed: Brett Canales Minor ACNP Adolph Pollack PCCM Pager [phone] till 3 pm If no answer page [phone] 06/17/2014, 12:05 PM   Reviewed above, examined.  He was transferred from [facility] to [facility] to assess anemia and renal failure.  Hb has been stable and not candidate for HD.  He can be transferred back to [facility] when bed available.  Discussed goals of care with pt's two daughters >> they are hopeful that he  can recover.  He will remain full code for now, but advised them to consider their options  depending on his progress.  Coralyn Helling, MD [facility] Pulmonary/Critical Care 06/17/2014, 1:13 PM Pager:  [phone] After 3pm call: [phone]

## 2014-06-17 NOTE — Clinical Social Work Note (Signed)
Clinical Social Worker spoke with Palliative Care MD in reference to [facility] for [facility]. Palliative Care MD reported he has spoken to medical director. Per attending MD, he will speak with pt's family in reference to code status prior to discharge. CSW to update FL-2 and submit pt's clinicals to [facility].  CSW remains available and will facilitate pt's discharge needs once medically stable.   Shawn FennelBashira Jillayne Lamb, MSW, LCSWA [phone] 06/17/2014 12:44 PM

## 2014-06-17 NOTE — Consult Note (Signed)
WOC wound consult note Reason for Consult: Consult requested for stage 3 wounds.  [facility], refer to progress notes on 10/21 Wound type:  Left buttock with stage 3 wound; 7X5X.2cm, 90% red, 10% yellow, mod amt yellow drainage, no odor Right buttock with 2 stage 3 wounds; 3X2X.2cm and 1X1X.2cm. Mod amt yellow drainage, no odor  Pressure Ulcer POA: Yes Periwound: Intact skin surrounding wounds Dressing procedure/placement/frequency: Aquacel to absorb drainage and provide antimicrobial benefits.  Foam dressing to protect site. Pt is on a Sport low-airloss bed to reduce pressure. No family members at bedside to discuss plan of care. Please re-consult if further assistance is needed.  Thank-you,  Cammie Mcgeeawn Charese Abundis MSN, RN, CWOCN, [place]AP, CNS [phone]

## 2014-06-17 NOTE — Plan of Care (Signed)
Problem: Phase III Progression Outcomes Goal: Changed to appropriate Path within 48 hrs extubation Outcome: Not Applicable Date Met:  07/68/08 Goal: Barriers To Progression Addressed/Resolved Outcome: Completed/Met Date Met:  06/17/14 Goal: Other Phase III Outcomes/Goals Outcome: Not Applicable Date Met:  81/10/31

## 2014-06-17 NOTE — Plan of Care (Signed)
Problem: Phase II Progression Outcomes Goal: Date pt extubated/weaned off vent Outcome: Not Applicable Date Met:  61/53/79 Goal: Time pt extubated/weaned off vent Outcome: Not Applicable Date Met:  43/27/61 Goal: Hemodynamically stable Outcome: Completed/Met Date Met:  06/17/14 Goal: Progress activities as ordered Outcome: Completed/Met Date Met:  06/17/14 Goal: Tolerating prescribed nutrition plan Outcome: Completed/Met Date Met:  06/17/14 Goal: Pain controlled Outcome: Completed/Met Date Met:  06/17/14 Goal: Code status re-addressed Outcome: Completed/Met Date Met:  06/17/14 Goal: Discharge/transfer plan updated Outcome: Completed/Met Date Met:  06/17/14 Goal: Other Phase II Outcomes/Goals Outcome: Completed/Met Date Met:  06/17/14

## 2014-06-17 NOTE — Clinical Social Work Psychosocial (Signed)
Clinical Social Work Department BRIEF PSYCHOSOCIAL ASSESSMENT 06/17/2014  Patient:  Shawn Lamb,Shawn Lamb     Account Number:  [id number]     Admit date:  06/15/2014  Clinical Social Worker:  Derenda FennelNIXON,Sandara Tyree, CLINICAL SOCIAL WORKER  Date/Time:  06/17/2014 11:26 AM  Referred by:  Care Management  Date Referred:  06/17/2014 Referred for  SNF Placement   Other Referral:   Interview type:  Other - See comment Other interview type:   CSW spoke with pt's daughters, Corrie DandyMary and CrittendenPatty via telephone.    PSYCHOSOCIAL DATA Living Status:  FACILITY Admitted from facility:   Level of care:   Primary support name:  [name] Banks and Ellin MayhewMary Mangino Primary support relationship to patient:  CHILD, ADULT Degree of support available:   Strong    CURRENT CONCERNS Current Concerns  Post-Acute Placement   Other Concerns:    SOCIAL WORK ASSESSMENT / PLAN Clinical Social Worker spoke with pt's daughters Corrie DandyMary and Alexia Freestoneatty in reference to pt's return to [facility] SNF. Pt's daughters reported pt has been a resident of [facility] SNF since August 2015. Pt's daughters agreeable to pt's return to [facility] however reported concerns of pt being discharged too soon and requested a call/ meeting with MD. CSW notified RNCM and MD of request. [facility] bed for pt's [facility] and also requested that Palliative Care Team contacts [facility]'s Medical Director, Angelina OkCrowley at [phone] for continued palliative care/ goals of care upon pt's return. CSW will notify Palliative Care Team of request. CSW continues to follow pt for continued suppot and to facilitate pt's discharge needs once medically stable.   Assessment/plan status:  Psychosocial Support/Ongoing Assessment of Needs Other assessment/ plan:   Information/referral to community resources:    PATIENT'S/FAMILY'S RESPONSE TO PLAN OF CARE: Pt unresponsive with tract and on ventilator. Pt's daughters agreeable to pt's return  to [facility] SNF however expressed concerns of pt's medical condition. CSW notified RNCM and MD.    Derenda FennelBashira Suzie Vandam, MSW, LCSWA [phone] 06/17/2014 11:59 AM

## 2014-06-17 NOTE — Progress Notes (Signed)
Patient has moderate fluid leak at PEG site, texture and color of tube feeding.  Cleaned patient and placed dressing, covered with rag to absorb fluid.  Dr. Craige CottaSood notified.

## 2014-06-17 NOTE — Progress Notes (Signed)
PULMONARY / CRITICAL CARE MEDICINE   Name: Shawn Lamb MRN: [id number] DOB: [birth year]    ADMISSION DATE:  06/15/2014  REFERRING MD :  Shawn Lamb  INITIAL PRESENTATION:  [name] admitted via Shawn Lamb after transfer there from Shawn [facility] hospital for eval of worsening renal function. He has ES lung disease and permanent VDRF with chronic kidney disease and history of MDR Klebsiellla. Also with Hgb 6.5 on admission. Family with unrealistic expectations  STUDIES/SIGNIFICANT EVENTS: 11/28 Renal consultation: deemed to not be a candidate for HD 11/30 return to Shawn Lamb.  SUBJECTIVE:  Unresponsive. No overt distress  VITAL SIGNS: Temp:  [97 F (36.1 C)-97.4 F (36.3 C)] 97 F (36.1 C) (11/30 0843) Pulse Rate:  [62-104] 77 (11/30 1100) Resp:  [15-24] 19 (11/30 1100) BP: (95-127)/(36-83) 127/83 mmHg (11/30 1100) SpO2:  [93 %-100 %] 96 % (11/30 1100) FiO2 (%):  [35 %] 35 % (11/30 0937) HEMODYNAMICS:   VENTILATOR SETTINGS: Vent Mode:  [-] PRVC FiO2 (%):  [35 %] 35 % Set Rate:  [16 bmp] 16 bmp Vt Set:  [550 mL] 550 mL PEEP:  [5 cmH20] 5 cmH20 Pressure Support:  [14 cmH20] 14 cmH20 Plateau Pressure:  [20 cmH20-23 cmH20] 20 cmH20 INTAKE / OUTPUT:  Intake/Output Summary (Last 24 hours) at 06/17/14 1142 Last data filed at 06/17/14 1000  Gross per 24 hour  Intake 1125.33 ml  Output   1540 ml  Net -414.67 ml    PHYSICAL EXAMINATION: General:  Chronically ill appearing, NAD Neuro:  RASS -3, no spont movement HEENT:  WNL Cardiovascular: Reg, no M Lungs: no wheezes Abdomen:  Mildly distended, +BS, G tube in place Ext: + edema, symmetric  LABS:  CBC  Recent Labs Lab 06/15/14 1140 06/16/14 0200  WBC 17.0* 16.9*  HGB 6.5* 7.1*  HCT 20.1* 21.9*  PLT 162 151   Coag's No results for input(s): APTT, INR in Shawn last 168 hours. BMET  Recent Labs Lab 06/15/14 1140 06/16/14 0200  NA 123* 125*  K 4.6 4.4  CL 85* 85*  CO2 21 19  BUN 177* 186*  CREATININE 3.76* 3.78*   GLUCOSE 403* 403*   Electrolytes  Recent Labs Lab 06/15/14 1140 06/16/14 0200  CALCIUM 10.4 9.7   Sepsis Markers  Recent Labs Lab 06/15/14 1209  LATICACIDVEN 2.91*   ABG  Recent Labs Lab 06/15/14 1145  PHART 7.326*  PCO2ART 41.4  PO2ART 112.0*   Liver Enzymes No results for input(s): AST, ALT, ALKPHOS, BILITOT, ALBUMIN in Shawn last 168 hours. Cardiac Enzymes No results for input(s): TROPONINI, PROBNP in Shawn last 168 hours. Glucose  Recent Labs Lab 06/16/14 1119 06/16/14 1603 06/16/14 1940 06/17/14 0026 06/17/14 0340 06/17/14 0841  GLUCAP 295* 252* 203* 203* 211* 190*    Imaging No results found.   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 Resume decadron 11/30 and dc solucortef  RENAL A:   AKI CKD P:   Monitor BMET intermittently Monitor I/Os Correct electrolytes as indicated Not a candidate for HD per renal  GASTROINTESTINAL A:   Chronic G tube P:   Begin TFs per protocol 11/29 Cont SUP  HEMATOLOGIC A:  Anemia without overt bleeding - ? GI losses Suspect component of anemia of renal failure P:  DVT px: SQ heparin Monitor CBC intermittently Transfuse per usual ICU guidelines Begin FeSO4 and Epogen 11/29  INFECTIOUS A:   Prior ESBL Klebsiella MRSA colonization Leukocytosis without obvious infection P:   MRSA PCR 11/28 >> POS resp 11/28 >>  Blood 11/28 >>GPC clusters>>  11/29 zoysn>> 11/29 vanco>>  ENDOCRINE A:   DM2 with hyperglycemia on chronic steroids ? Why. P:   Cont SSI  NEUROLOGIC A:  Advanced age Chronic severe debilitation Acute  encephalopathy P:   RASS goal: -1 PRN sedation and analgesia as ordered Palliative Care consultation noted and appreciated.    FAMILY  - Updates:  No family @ bedside this AM     TODAY'S SUMMARY:  Shawn family expressed very unrealistic goals and remarkable lack of insight in our discussions 11/28. Shawn daughter expressed a hope that pt would be liberated from Shawn vent and return to home. This is clearly not going to happen. She expressed a desire to donate him one of her kidneys, evidence of limited insight. Based on medical futility, he has been made DNR in Shawn event of cardiac arrest. Dr Hyman HopesWebb has deemed pt to not be a candidate for HD of any duration, a decision that is strongly supported by PCCM. As such, we are providing no therapies or level of care that cannot be provided @ Shawn Lamb. He will be transferred back to Shawn Lamb 11/30.  Brett CanalesSteve Minor ACNP [facility] PCCM Pager [phone] till 3 pm If no answer page [phone] 06/17/2014, 11:42 AM   Coralyn HellingVineet Taishaun Levels, MD [facility] Pulmonary/Critical [facility] 06/17/2014, 4:09 PM Pager:  [phone] After 3pm call: [phone]

## 2014-06-17 NOTE — Plan of Care (Signed)
Problem: Phase III Progression Outcomes Goal: Activity advanced as tolerated Outcome: Not Applicable Date Met:  85/99/23 Goal: Changed to appropriate Path within 48 hrs extubation Outcome: Progressing Goal: Complications resolved/controlled Outcome: Completed/Met Date Met:  06/17/14 Goal: Barriers To Progression Addressed/Resolved Outcome: Progressing

## 2014-06-17 NOTE — Clinical Social Work Note (Signed)
Clinical Social Worker facilitated patient discharge including contacting patient family and facility to confirm patient discharge plans.  Clinical information faxed to facility and family agreeable with plan.  RN reported he will arrange ambulance [facility]  RN to call report prior to discharge.  Clinical Social Worker will sign off for now as social work intervention is no longer needed. Please consult us again if new need arises.  Derenda FennelBashira Nickolus Wadding, MSW, LCSWA [phone] 06/17/2014 2:20 PM

## 2014-06-17 NOTE — Progress Notes (Addendum)
No family at bedside today.  Spoke with daughter Rikki Spearingatty Ann over phone.  Let her know of potential bacteremia with MDR organism and plans to try and get Cuinn back to [facility].  Attempted to reach other daughter Corrie DandyMary but unable to do so.  Will try to help family work through this. Their insight is poor, but this is not from a lack of physicians failing to relay how poor his prognosis is.  Nothing they are hearing here is new.  I suspect more of their difficulty stems from anticipatory grief.  Will continue to follow along while he is in hospital.   Orvis BrillAaron J. Tymber Stallings D.O. Palliative Medicine Team at [facility]  Pager: [phone] Team Phone: [phone]  ADDENDUM: I spoke with Dr Angelina Okrowley from [facility] this afternoon.   She is aware of evaluation here including Nephrology opinion that dialysis would not be indicated if he reached ESRD.  She is willing to resume care at [facility], even if code status were to change back to Full.  She will speak with Nephrology group that consults at [facility].  Feel free to contact me with any questions or concerns.    Orvis BrillAaron J. Richards Pherigo D.O. Palliative Medicine Team at [facility]  Pager: [phone] Team Phone: [phone]

## 2014-06-17 NOTE — Plan of Care (Signed)
Problem: Phase I Progression Outcomes Goal: ARDS Protocol initiated if indicated Outcome: Not Applicable Date Met:  90/93/11 Goal: Sepsis Protocol initiated if indicated Outcome: Not Applicable Date Met:  21/62/44 Goal: Code status addressed with pt/family Outcome: Completed/Met Date Met:  06/17/14 Goal: Pain controlled with appropriate interventions Outcome: Completed/Met Date Met:  06/17/14 Goal: Hemodynamically stable Outcome: Completed/Met Date Met:  06/17/14 Goal: Baseline oxygen/pH stable Outcome: Completed/Met Date Met:  06/17/14 Goal: Patient tolerating nututrition at goal Outcome: Not Applicable Date Met:  69/50/72 Goal: Progressing towards optiumm acitivities Outcome: Progressing Goal: Patient tolerating weaning plan Outcome: Not Applicable Date Met:  25/75/05 Goal: Tracheostomy by Vent Day 14 Outcome: Not Applicable Date Met:  18/33/58 Goal: Optimized method of communication. Outcome: Not Applicable Date Met:  25/18/98 Goal: Initial discharge plan identified Outcome: Completed/Met Date Met:  06/17/14 Goal: Voiding-avoid urinary catheter unless indicated Outcome: Not Applicable Date Met:  42/10/31 Goal: Other Phase I Outcomes/Goals Outcome: Completed/Met Date Met:  06/17/14

## 2014-06-17 NOTE — Plan of Care (Signed)
Problem: Phase III Progression Outcomes Goal: Transfer/discharge plan in place Outcome: Completed/Met Date Met:  06/17/14 Goal: Pain controlled with appropriate interventions Outcome: Completed/Met Date Met:  06/17/14 Goal: Hemodynamically stable Outcome: Completed/Met Date Met:  06/17/14 Goal: Nutritional plan in place Outcome: Completed/Met Date Met:  06/17/14

## 2014-06-17 NOTE — Progress Notes (Addendum)
ANTIBIOTIC CONSULT NOTE - FOLLOW UP  Pharmacy Consult for Vancomycin Indication: Bacteremia  Allergies  Allergen Reactions  . Sulfa Antibiotics Other (See Comments)    unknown  . Vioxx [Rofecoxib] Other (See Comments)    unknown  . Vancomycin Rash  . Zyvox [Linezolid] Rash    Patient Measurements: Height: 5\' 4"  (162.6 cm) Weight: 206 lb 12.7 oz (93.8 kg) IBW/kg (Calculated) : 59.2 Adjusted Body Weight: 73 kg  Vital Signs: Temp: 97 F (36.1 C) (11/30 0843) Temp Source: Oral (11/30 0843) BP: 117/70 mmHg (11/30 0900) Pulse Rate: 104 (11/30 0900) Intake/Output from previous day: 11/29 0701 - 11/30 0700 In: 995.3 [I.V.:170; NG/GT:446.3; IV Piggyback:349] Out: 1650 [Urine:1650] Intake/Output from this shift: Total I/O In: 230 [I.V.:30; NG/GT:150; IV Piggyback:50] Out: 150 [Urine:150]  Labs:  Recent Labs  06/15/14 1140 06/16/14 0200  WBC 17.0* 16.9*  HGB 6.5* 7.1*  PLT 162 151  CREATININE 3.76* 3.78*   Estimated Creatinine Clearance: 13.7 mL/min (by C-G formula based on Cr of 3.78). No results for input(s): VANCOTROUGH, VANCOPEAK, VANCORANDOM, GENTTROUGH, GENTPEAK, GENTRANDOM, TOBRATROUGH, TOBRAPEAK, TOBRARND, AMIKACINPEAK, AMIKACINTROU, AMIKACIN in the last 72 hours.   Microbiology: Recent Results (from the past 720 hour(s))  MRSA PCR Screening     Status: Abnormal   Collection Time: 06/15/14  3:54 PM  Result Value Ref Range Status   MRSA by PCR POSITIVE (A) NEGATIVE Final    Comment:        The GeneXpert MRSA Assay (FDA approved for NASAL specimens only), is one component of a comprehensive MRSA colonization surveillance program. It is not intended to diagnose MRSA infection nor to guide or monitor treatment for MRSA infections. RESULT CALLED TO, READ BACK BY AND VERIFIED WITH: SORCHA,D RN 06/15/14 1916 WOOTEN,K   Culture, blood (routine x 2)     Status: None (Preliminary result)   Collection Time: 06/15/14  4:40 PM  Result Value Ref Range Status   Specimen Description BLOOD LEFT HAND  Final   Special Requests BOTTLES DRAWN AEROBIC AND ANAEROBIC [id number]  Final   Culture  Setup Time   Final    06/15/2014 21:18 Performed at [facility]    Culture   Final    GRAM NEGATIVE RODS Note: CRITICAL RESULT CALLED TO, READ BACK BY AND VERIFIED WITH: CARLA PORTER RN @755PM  [facility] GRAM POSITIVE COCCI IN CLUSTERS Note: CRITICAL RESULT CALLED TO, READ BACK BY AND VERIFIED WITH: Jamesetta SoARLA PORTER RN @ [phone] [facility]    Report Status PENDING  Incomplete  Culture, blood (routine x 2)     Status: None (Preliminary result)   Collection Time: 06/15/14  4:50 PM  Result Value Ref Range Status   Specimen Description BLOOD LEFT ARM  Final   Special Requests BOTTLES DRAWN AEROBIC AND ANAEROBIC 10CC  Final   Culture  Setup Time   Final    06/15/2014 21:18 Performed at [facility]    Culture   Final    GRAM POSITIVE COCCI IN CLUSTERS Note: Gram Stain Report Called to,Read Back By and Verified With: Jamesetta Soarla Porter RN on 06/16/14 at 23:35 by Christie NottinghamAnne Skeen Performed at [facility]    Report Status PENDING  Incomplete  Culture, respiratory (NON-Expectorated)     Status: None (Preliminary result)   Collection Time: 06/15/14  5:21 PM  Result Value Ref Range Status   Specimen Description TRACHEAL ASPIRATE  Final   Special Requests NONE  Final   Gram Stain   Final  ABUNDANT WBC PRESENT, PREDOMINANTLY PMN RARE SQUAMOUS EPITHELIAL CELLS PRESENT FEW GRAM POSITIVE COCCI IN PAIRS RARE GRAM POSITIVE RODS Performed at [facility]    Culture   Final    Culture reincubated for better growth Performed at [facility]    Report Status PENDING  Incomplete    Anti-infectives    Start     Dose/Rate Route Frequency Ordered Stop   06/20/14 0600  vancomycin (VANCOCIN) IVPB 1000 mg/200 mL premix  Status:  Discontinued     1,000 mg100 mL/hr over 120 Minutes Intravenous every 72 hours 06/17/14 0015  06/17/14 1129   06/19/14 0100  vancomycin (VANCOCIN) IVPB 1000 mg/200 mL premix     1,000 mg100 mL/hr over 120 Minutes Intravenous Every 48 hours 06/17/14 1129     06/17/14 0030  vancomycin (VANCOCIN) 1,250 mg in sodium chloride 0.9 % 250 mL IVPB     1,250 mg83.3 mL/hr over 180 Minutes Intravenous  Once 06/17/14 0015 06/17/14 0345   06/16/14 2100  piperacillin-tazobactam (ZOSYN) IVPB 2.25 g     2.25 g100 mL/hr over 30 Minutes Intravenous Every 6 hours 06/16/14 2017        Assessment: [age] yoM presents with acute on chronic kidney failure from [facility]. Pharmacy consulted on 11/29 to dose vancomycin and zosyn for positive blood cultures with gram negative rods and gram positive cocci. Patient is afebrile, WBC 16.9. SCr 3.78 (BL ~ 2.5?), with stable urine output. Patient has received Zosyn 2.25g q6h and Vancomycin 1250 mg IV X 1.  Maintenance dose of vancomycin was ordered as 1 g q72h. Based on creatinine clearance and urine output, will decrease the interval to q48h.  Of note, patient has a documented allergy from [facility] of rash with vancomycin. Per nursing, no rash noted overnight. He is being pretreated with benadryl prior to Vancomycin dose.  Goal of Therapy:  Vancomycin trough level 15-20 mcg/ml  Plan:  1) Start Vancomycin 1000 mg IV q48h, starting on 06/19/14 @ 0100 2) Continue Zosyn 2.25 g IV q6h 3) Benadryl IV 25 mg q48h 3) Follow up sputum and culture sensitivities and narrow therapy as indicated 4) Monitor renal function and urine output and check VT as indicated. 5) Monitor for development of drug rash  Russ HaloAshley Minela [place], PharmD Clinical Pharmacist - Resident Pager: [phone] 11/30/201511:38 AM

## 2014-06-18 LAB — CULTURE, RESPIRATORY W GRAM STAIN

## 2014-06-18 LAB — CULTURE, RESPIRATORY

## 2014-06-19 LAB — CULTURE, BLOOD (ROUTINE X 2)

## 2014-06-20 LAB — CULTURE, BLOOD (ROUTINE X 2)

## 2014-07-19 DEATH — deceased

## 2015-03-18 IMAGING — CR DG CHEST 1V PORT
1 series · 1 of 1 positions shown · non-contrast
Comparison: 05/08/2014 and 05/06/2014.

CLINICAL DATA: PICC line adjustment.

EXAM:
PORTABLE CHEST - 1 VIEW

[AP]
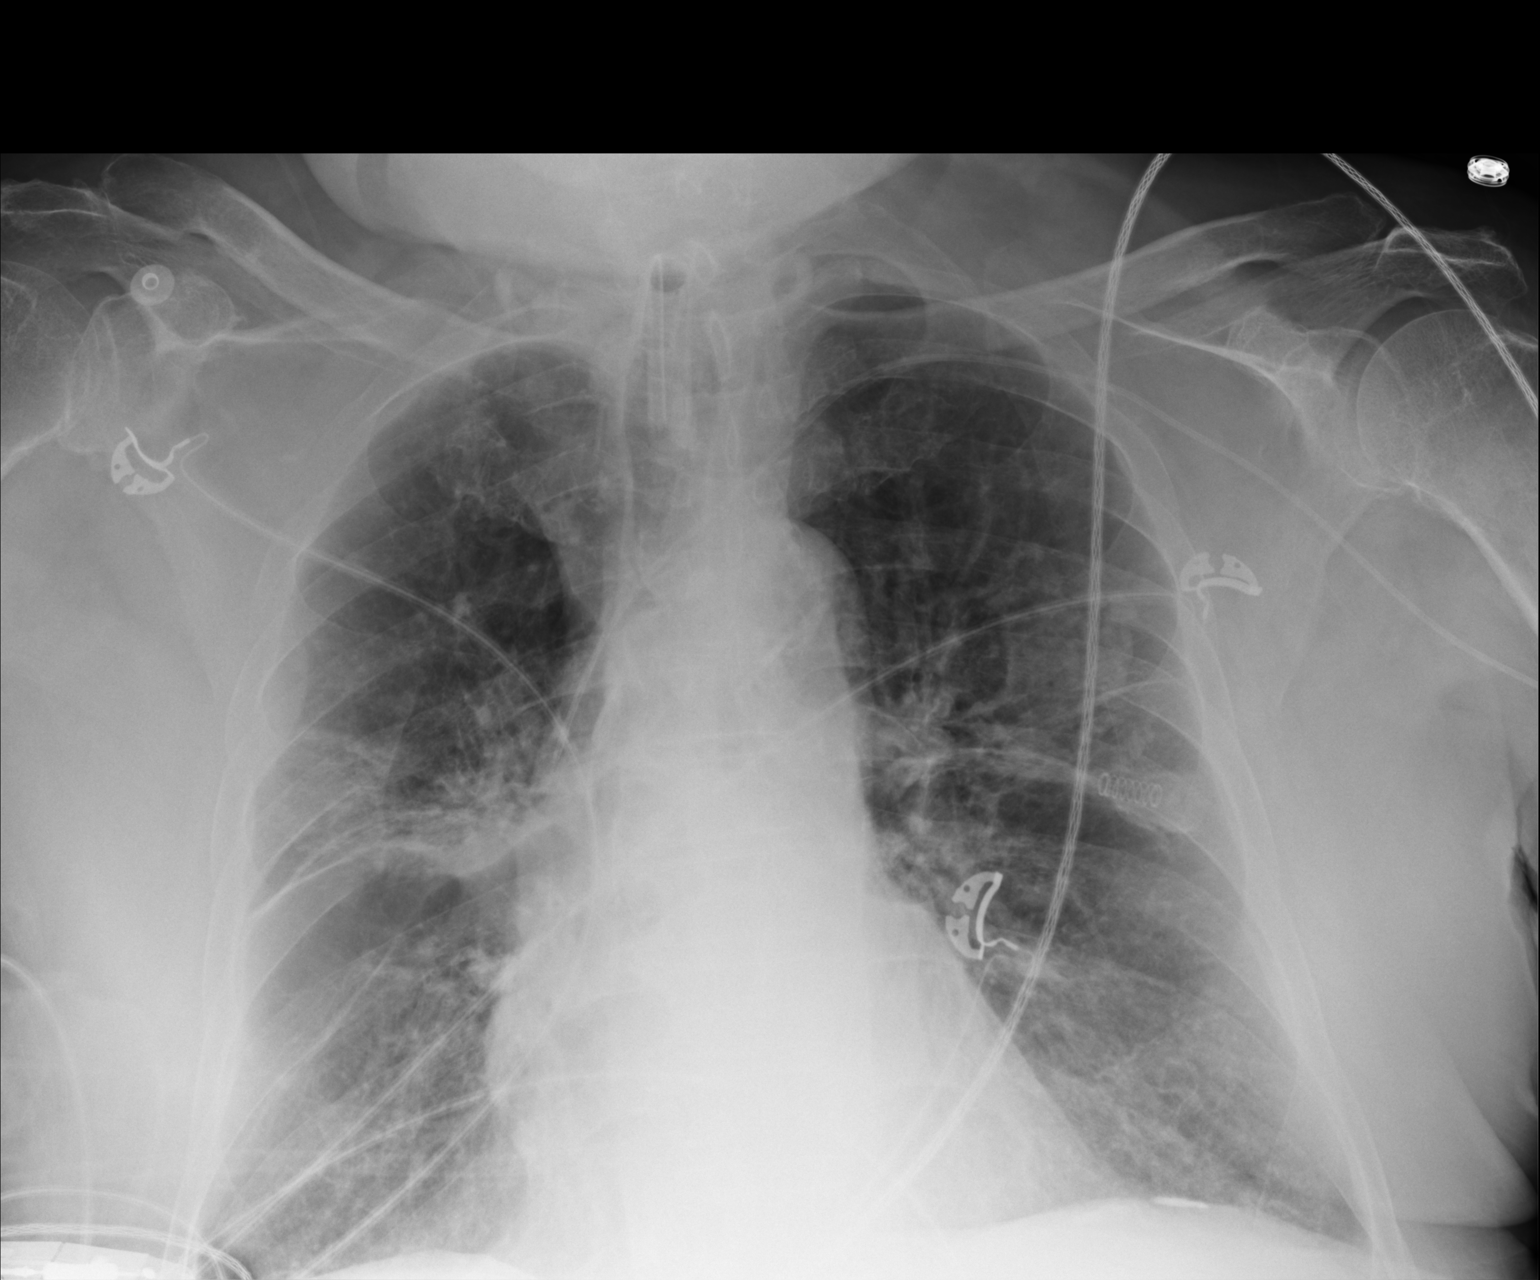

[1 of 1 positions shown; findings below may reference images not displayed]

FINDINGS: 0056 hr. Left arm PICC extends to the level of the lower SVC. There
is no suggestion of a curved distal appearance on this repeat AP
view. The previously noted right arm PICC overlapping the right
axilla is no longer visualized. Tracheostomy appears unchanged.
There is stable perihilar opacity within the right lung. The heart
size and mediastinal contours are stable.
IMPRESSION: Left arm PICC extends to the level of the lower SVC and demonstrates
a normal configuration on this single AP view.

## 2015-03-24 IMAGING — CR DG CHEST 1V PORT
2 series · 2 of 2 positions shown · non-contrast
Comparison: 05/12/2014

CLINICAL DATA: Pneumonia

EXAM:
PORTABLE CHEST - 1 VIEW

[AP (1 of 2)]
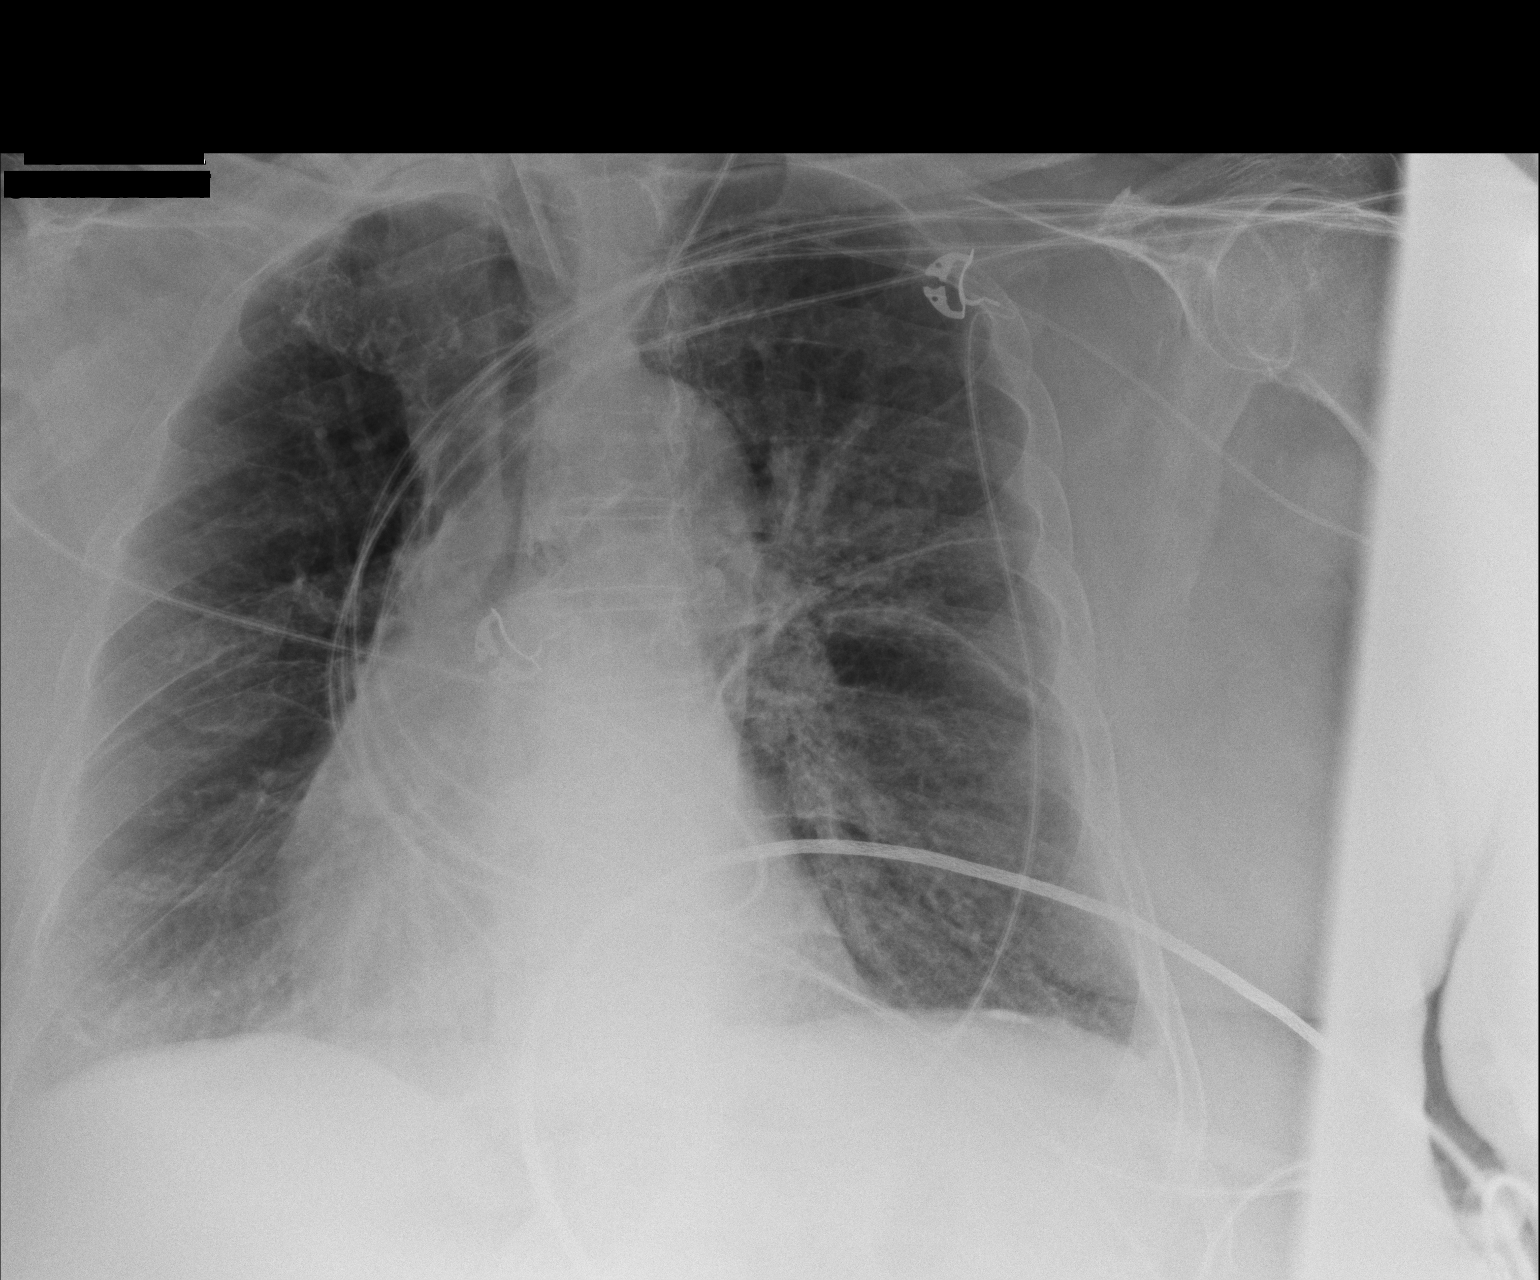

[AP (2 of 2)]
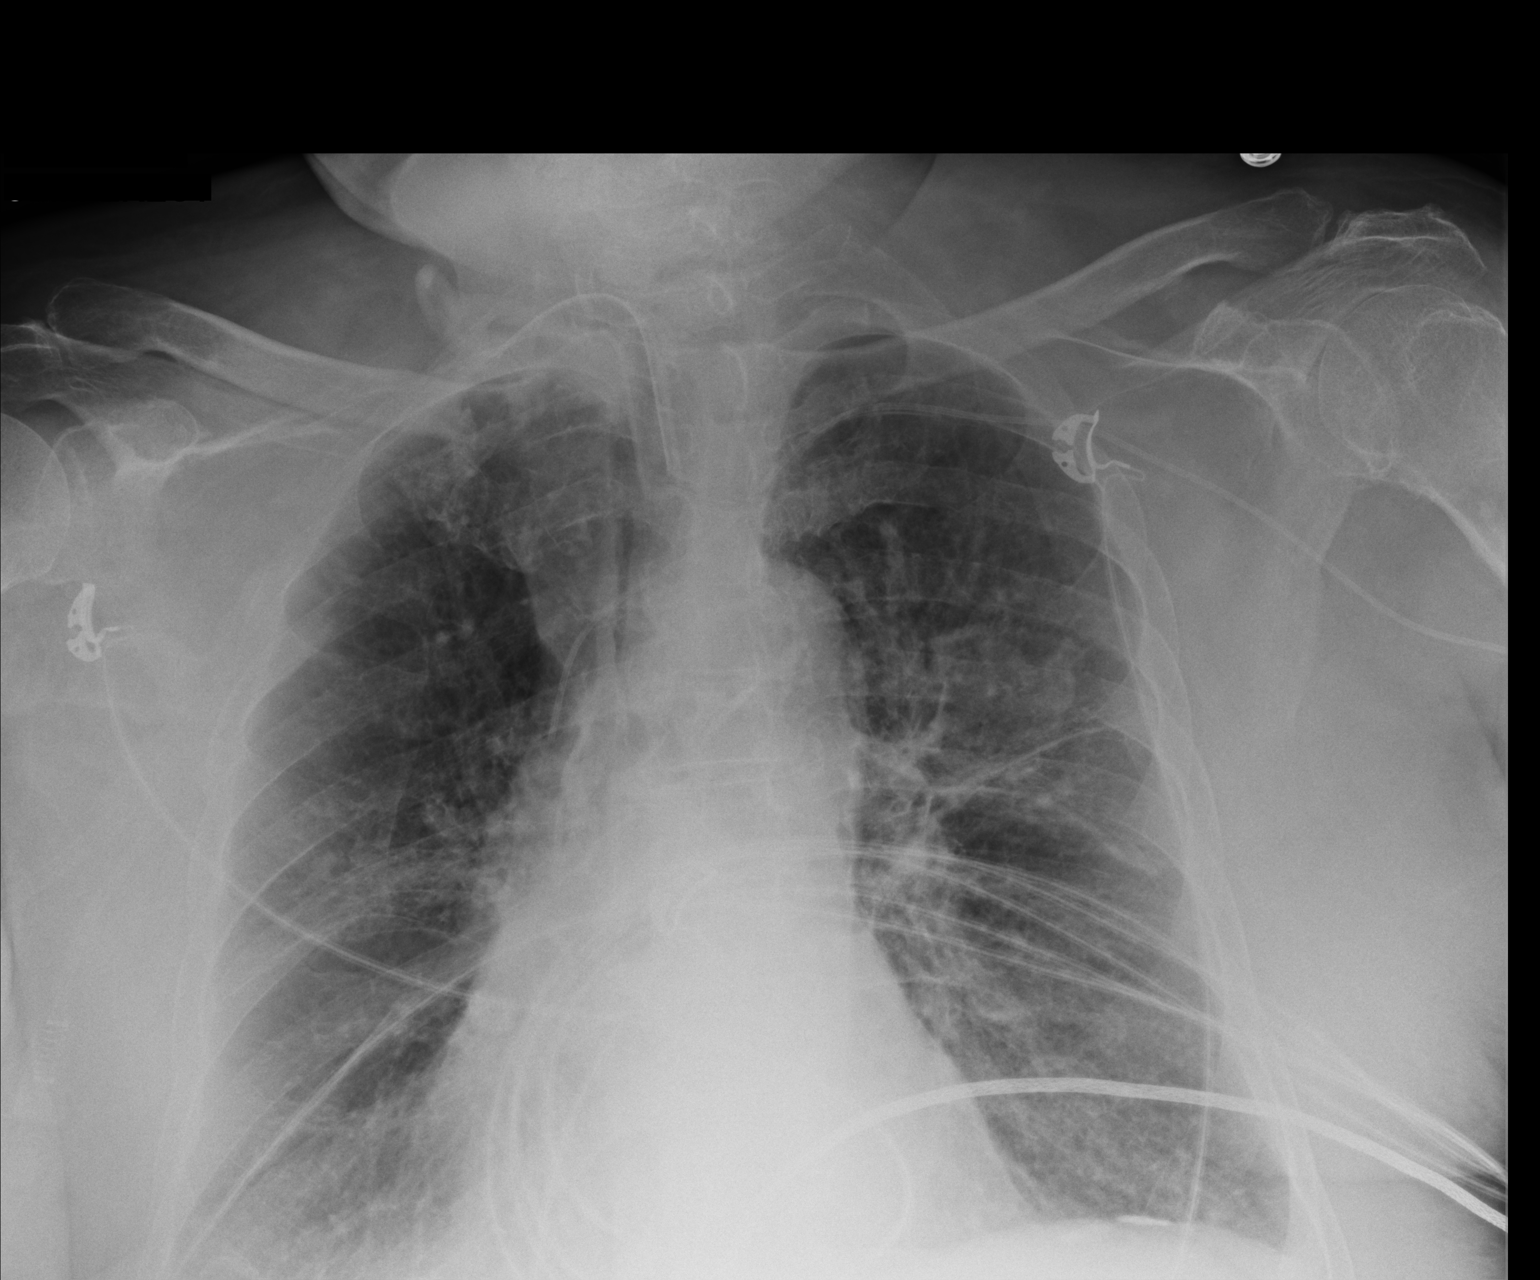

[2 of 2 positions shown; findings below may reference images not displayed]

FINDINGS: Tracheostomy tube in satisfactory position. No significant pleural
effusion or pneumothorax. Bilateral perihilar interstitial
thickening, left greater than right. The right perihilar airspace
opacity demonstrates interval resolution. Stable cardiomediastinal
silhouette.

The osseous structures are unremarkable.
IMPRESSION: 1. Bilateral interstitial thickening, left greater than right, which
may reflect mild interstitial edema versus chronic interstitial lung
disease.

## 2015-04-25 IMAGING — CR DG CHEST 1V PORT
1 series · 1 of 1 positions shown · non-contrast
Comparison: 05/14/2014

ADDENDUM:
The right PICC tip is in the right subclavian vein.
CLINICAL DATA: Renal failure, history of diabetes

EXAM:
PORTABLE CHEST - 1 VIEW

[AP]
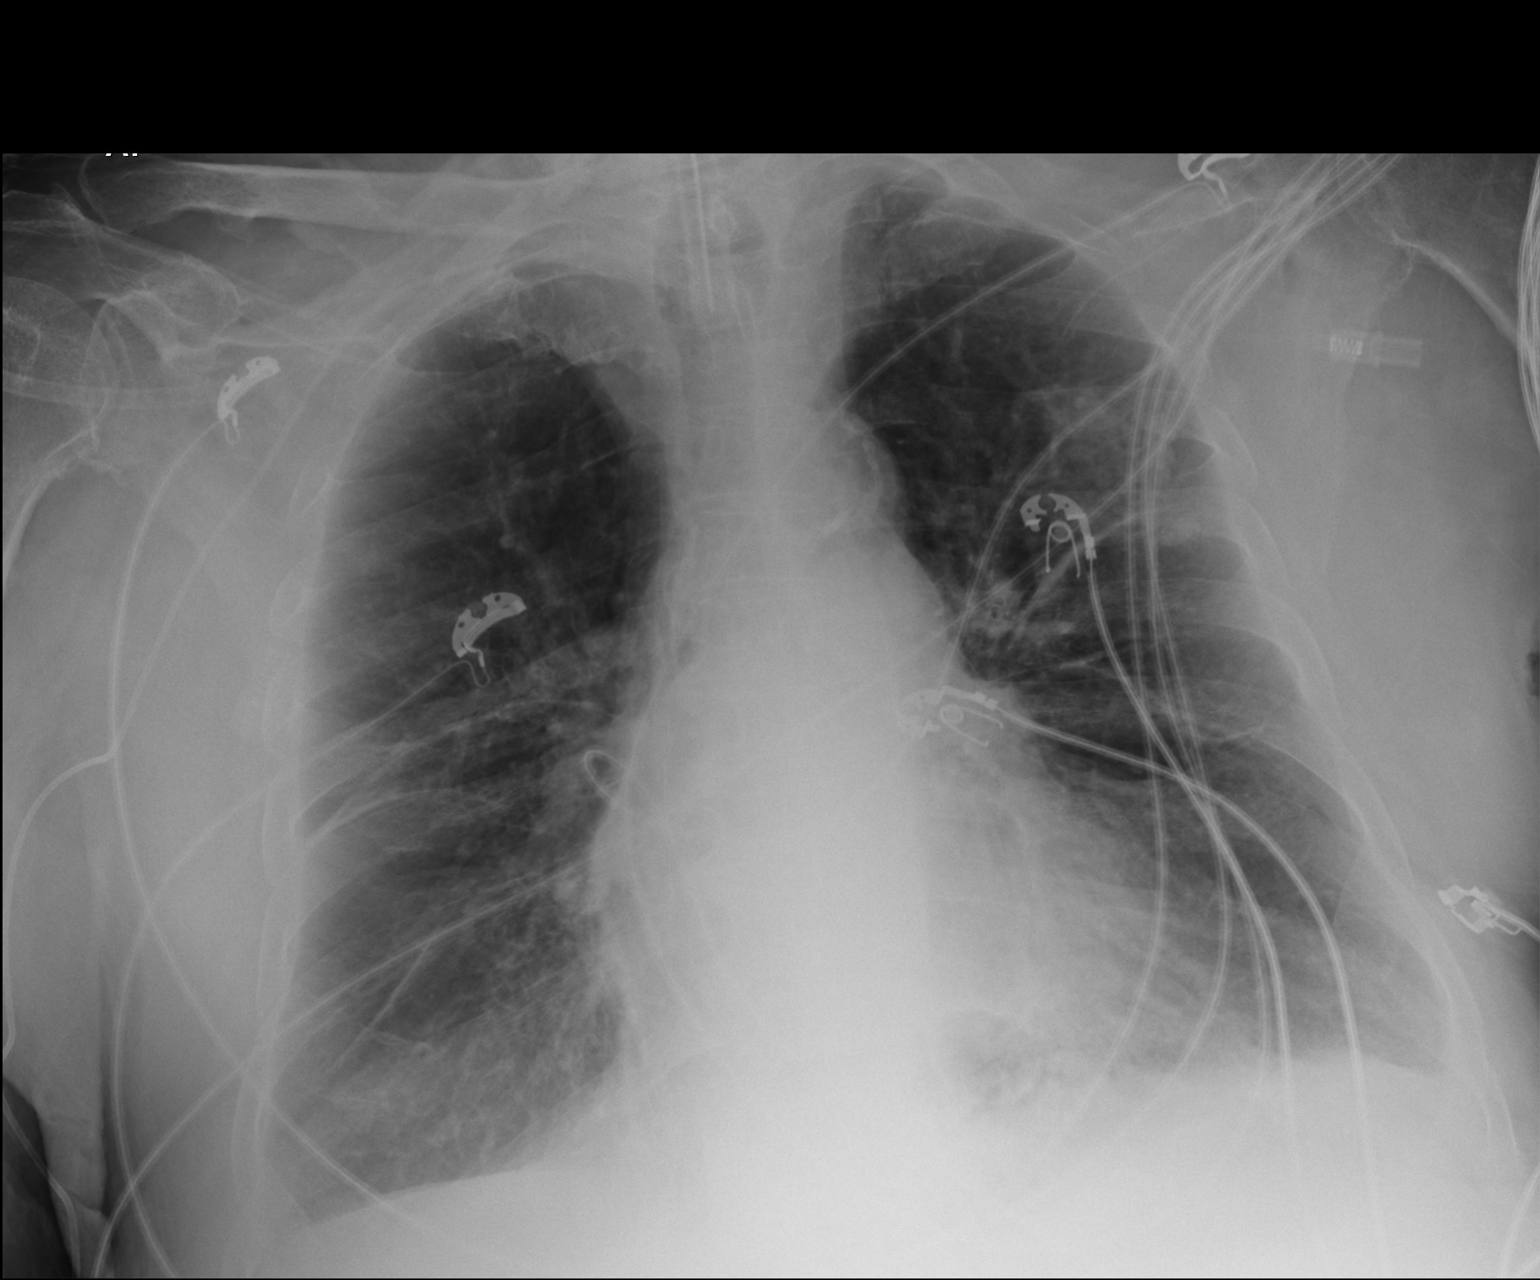

[1 of 1 positions shown; findings below may reference images not displayed]

FINDINGS: Tracheostomy tube unchanged. Heart size and vascular pattern normal.
Hyperinflation suggests COPD. Mild scarring or atelectasis in both
midlung zones. Mild chronic bronchitic change. Twelve nodular
opacity projects laterally over the left upper lobe not seen on the
prior study. It measures about 29 x 59 mm. This is suboptimally
evaluated due to numerous overlying tubes.
IMPRESSION: COPD. 6 x 3 cm opacity laterally left upper lobe. This could
represent developing pneumonia. Recommend PA and lateral radiograph
with attention to removing overlying tubes and leads.
